# Patient Record
Sex: Male | Born: 1960 | Race: Black or African American | Hispanic: No | Marital: Single | Smoking: Former smoker
Health system: Southern US, Community
[De-identification: ages and names within clinical notes are randomized; demographics above are authoritative.]

## PROBLEM LIST (undated history)

## (undated) ENCOUNTER — Emergency Department (HOSPITAL_COMMUNITY): Admission: EM | Payer: Self-pay | Source: Home / Self Care

## (undated) DIAGNOSIS — G4733 Obstructive sleep apnea (adult) (pediatric): Secondary | ICD-10-CM

## (undated) DIAGNOSIS — M25559 Pain in unspecified hip: Secondary | ICD-10-CM

## (undated) DIAGNOSIS — E119 Type 2 diabetes mellitus without complications: Secondary | ICD-10-CM

## (undated) DIAGNOSIS — G43909 Migraine, unspecified, not intractable, without status migrainosus: Secondary | ICD-10-CM

## (undated) DIAGNOSIS — F329 Major depressive disorder, single episode, unspecified: Secondary | ICD-10-CM

## (undated) DIAGNOSIS — F32A Depression, unspecified: Secondary | ICD-10-CM

## (undated) DIAGNOSIS — K760 Fatty (change of) liver, not elsewhere classified: Secondary | ICD-10-CM

## (undated) DIAGNOSIS — G8929 Other chronic pain: Secondary | ICD-10-CM

## (undated) DIAGNOSIS — I10 Essential (primary) hypertension: Secondary | ICD-10-CM

## (undated) HISTORY — DX: Major depressive disorder, single episode, unspecified: F32.9

## (undated) HISTORY — DX: Essential (primary) hypertension: I10

## (undated) HISTORY — DX: Migraine, unspecified, not intractable, without status migrainosus: G43.909

## (undated) HISTORY — DX: Depression, unspecified: F32.A

## (undated) HISTORY — PX: HIP SURGERY: SHX245

---

## 1986-03-15 HISTORY — PX: WRIST SURGERY: SHX841

## 2013-12-25 ENCOUNTER — Telehealth: Payer: Self-pay | Admitting: Neurology

## 2013-12-25 ENCOUNTER — Ambulatory Visit (INDEPENDENT_AMBULATORY_CARE_PROVIDER_SITE_OTHER): Payer: Medicare HMO | Admitting: Neurology

## 2013-12-25 ENCOUNTER — Encounter: Payer: Self-pay | Admitting: Neurology

## 2013-12-25 ENCOUNTER — Encounter (INDEPENDENT_AMBULATORY_CARE_PROVIDER_SITE_OTHER): Payer: Self-pay

## 2013-12-25 VITALS — BP 106/67 | HR 83 | Ht 72.0 in | Wt 304.0 lb

## 2013-12-25 DIAGNOSIS — R45851 Suicidal ideations: Secondary | ICD-10-CM

## 2013-12-25 DIAGNOSIS — G44221 Chronic tension-type headache, intractable: Secondary | ICD-10-CM

## 2013-12-25 DIAGNOSIS — Z915 Personal history of self-harm: Secondary | ICD-10-CM

## 2013-12-25 DIAGNOSIS — G44229 Chronic tension-type headache, not intractable: Secondary | ICD-10-CM

## 2013-12-25 DIAGNOSIS — R419 Unspecified symptoms and signs involving cognitive functions and awareness: Secondary | ICD-10-CM

## 2013-12-25 DIAGNOSIS — F99 Mental disorder, not otherwise specified: Secondary | ICD-10-CM

## 2013-12-25 DIAGNOSIS — R413 Other amnesia: Secondary | ICD-10-CM

## 2013-12-25 DIAGNOSIS — R0683 Snoring: Secondary | ICD-10-CM

## 2013-12-25 DIAGNOSIS — Z9151 Personal history of suicidal behavior: Secondary | ICD-10-CM

## 2013-12-25 DIAGNOSIS — Z9189 Other specified personal risk factors, not elsewhere classified: Secondary | ICD-10-CM

## 2013-12-25 DIAGNOSIS — G4710 Hypersomnia, unspecified: Secondary | ICD-10-CM

## 2013-12-25 MED ORDER — TOPIRAMATE 25 MG PO TABS: 50.0000 mg | ORAL_TABLET | Freq: Two times a day (BID) | ORAL | Status: DC

## 2013-12-25 MED ORDER — TOPIRAMATE 25 MG PO TABS: ORAL_TABLET | ORAL | Status: DC
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
## 2013-12-25 NOTE — Telephone Encounter (Signed)
Patient calling to state that he went to his [facility] pharmacy on [address] to pick up his Topamax script but they said that hadn't received anything yet, please call and advise.

## 2013-12-25 NOTE — Telephone Encounter (Signed)
Rx has been resent.  I called the patient back, he is aware.

## 2013-12-25 NOTE — Patient Instructions (Addendum)
Overall you are doing fairly well but I do want to suggest a few things today:   Remember to drink plenty of fluid, eat healthy meals and do not skip any meals. Try to eat protein with a every meal and eat a healthy snack such as fruit or nuts in between meals. Try to keep a regular sleep-wake schedule and try to exercise daily, particularly in the form of walking, 20-30 minutes a day, if you can.   As far as your medications are concerned, I would like to suggest Topamax 25mg  pills. Most common side effects include weight loss, tingling in fingers, change in taste, cognitive changes. Start slowly and increase: Week one: One tablet at night Week two: 2 tablets at night Week three: 2 tablets at night and one tablet in morning Week four: 2 tablets twice daily  As far as diagnostic testing: MRI of the brain, sleep study. We will call to schedule.  I would like to see you back in 3 months, sooner if we need to. Please call us with any interim questions, concerns, problems, updates or refill requests.   Please also call us for any test results so we can go over those with you on the phone.  My clinical assistant and will answer any of your questions and relay your messages to me and also relay most of my messages to you.   Our phone number is [phone]. We also have an after hours call service for urgent matters and there is a physician on-call for urgent questions. For any emergencies you know to call 911 or go to the nearest emergency room

## 2013-12-26 ENCOUNTER — Telehealth: Payer: Self-pay | Admitting: Neurology

## 2013-12-26 ENCOUNTER — Encounter: Payer: Self-pay | Admitting: Neurology

## 2013-12-26 DIAGNOSIS — G4710 Hypersomnia, unspecified: Secondary | ICD-10-CM | POA: Insufficient documentation

## 2013-12-26 DIAGNOSIS — R45851 Suicidal ideations: Secondary | ICD-10-CM | POA: Insufficient documentation

## 2013-12-26 DIAGNOSIS — R0683 Snoring: Secondary | ICD-10-CM | POA: Insufficient documentation

## 2013-12-26 DIAGNOSIS — R419 Unspecified symptoms and signs involving cognitive functions and awareness: Secondary | ICD-10-CM | POA: Insufficient documentation

## 2013-12-26 DIAGNOSIS — Z915 Personal history of self-harm: Secondary | ICD-10-CM | POA: Insufficient documentation

## 2013-12-26 DIAGNOSIS — G4489 Other headache syndrome: Secondary | ICD-10-CM | POA: Insufficient documentation

## 2013-12-26 DIAGNOSIS — Z9151 Personal history of suicidal behavior: Secondary | ICD-10-CM | POA: Insufficient documentation

## 2013-12-26 NOTE — Addendum Note (Signed)
Addended by: Naomie DeanAHERN, Sadye Kiernan B on: 12/26/2013 11:58 PM   Modules accepted: Orders

## 2013-12-26 NOTE — Telephone Encounter (Signed)
Raymond MooreCasandra - Would you call patient and let him know that I would like him to have some labwork done. Orders are placed, he can go to any [facility] or come to our office during [facility] hours. Thanks.

## 2013-12-27 ENCOUNTER — Telehealth: Payer: Self-pay | Admitting: Neurology

## 2013-12-27 DIAGNOSIS — G4719 Other hypersomnia: Secondary | ICD-10-CM

## 2013-12-27 DIAGNOSIS — R519 Headache, unspecified: Secondary | ICD-10-CM

## 2013-12-27 DIAGNOSIS — R0683 Snoring: Secondary | ICD-10-CM

## 2013-12-27 DIAGNOSIS — R51 Headache: Secondary | ICD-10-CM

## 2013-12-27 NOTE — Telephone Encounter (Signed)
Dr. Lucia GaskinsAhern, refers patient for attended sleep study.  Height: 6'  Weight: 304lbs  BMI: 41.22  Past Medical History:  Hypertension  .  Migraine  .  Depression    Sleep Symptoms: Morbid obesity, HTN, snoring, daytime somnolence, headache. Snores very heavily, "runs people out of the room", has to nap during the day. Doesn't sleep through the night. Has been waking up frequently. Insomnia, depression, not enough sleep.    Epworth Score: Unable to reach the patient.   Medication: Albuterol Sulfate (Aero Soln) PROAIR HFA 108 (90 BASE) MCG/ACT Inhale 1 puff into the lungs every 6 (six) hours as needed. Cetirizine HCl (Tablet Dispersible) Cetirizine HCl 10 MG Take 1 tablet by mouth daily. Fluticasone Propionate (Suspension) FLONASE 50 MCG/ACT Place 2 sprays into both nostrils daily. Hydrocodone-Acetaminophen (Tab) NORCO/VICODIN 5-325 MG Take 1 tablet by mouth as needed. Losartan Potassium-HCTZ (Tab) HYZAAR 50-12.5 MG Take 1 tablet by mouth daily. Omeprazole (Capsule Delayed Release) PRILOSEC 20 MG Take 1 capsule by mouth daily. Simvastatin (Tab) ZOCOR 10 MG Take 1 tablet by mouth daily. Topiramate (Tab) TOPAMAX 25 MG Week 1: 1 tablet at night; Week 2: 2 tablets at night; Week 3: 2 tablets at night and 1 tablet in morn;Week 4: 2 tablets twice daily     Ins: [employer]/Medicaid   Assessment & Plan: 53 year old male with a PMHx of psychiatric problems, DM, morbid obesity, HTN, snoring, daytime somnolence, headache. Neuro exam is essentially normal with a MoCA of 28/30. I strongly suspect this patient has OSA which is likely a big contributor to both headaches and perceived memory changes. Orderine sleep study.  Memory loss: I suspect the perceived memory loss is multifactorial with contributions from obstructive sleep apnea, psychiatric problems and previous TBIs. Will order an MRI of the head. Will order a sleep study. Needs to continue regular and [facility], denies having plans of hurting himself or others but endorses suicidal thoughts. Will order serum labs.  Headache: suspect also multifactorial due to his psychiatric problems and stress, morbid obesity and likely OSA and maybe previous TBIs. Will order a sleep study and MRi of the brain. First line medications for tension-type headaches include TCAs however I worry those will cause weight gain and more memory complaints in this morbidly obese patient. Will start Topamax despite its cognitive side effects as this is a great headache medication and may help with weight loss.  RTC in 3 months   Please review patient information and submit instructions for scheduling and orders for sleep technologist. Thank you.

## 2013-12-27 NOTE — Telephone Encounter (Signed)
Called patient. Gave instructions per Dr. Trevor MaceAhern's previous note. Patient agreed to come to our office on 12/28/13. Provided lab hours.

## 2013-12-27 NOTE — Telephone Encounter (Signed)
Sleep study request review: This patient has an underlying medical history of severe obesity, hypertension, depression and migraine headaches and is referred by Dr. Lucia GaskinsAhern for an attended sleep study due to a report of loud snoring, and excessive daytime somnolence as well as recurrent headaches. I will order a split-night sleep study and see the patient in sleep medicine consultation afterwards. Please print this note and attach to chart.   Technologist instructions: Please score at 4% and split if 2 hour estimated AHI >15/h.    Huston FoleySaima Giordan Fordham, MD, PhD [name])

## 2013-12-28 ENCOUNTER — Other Ambulatory Visit (INDEPENDENT_AMBULATORY_CARE_PROVIDER_SITE_OTHER): Payer: Self-pay

## 2013-12-28 DIAGNOSIS — Z0289 Encounter for other administrative examinations: Secondary | ICD-10-CM

## 2013-12-28 DIAGNOSIS — R413 Other amnesia: Secondary | ICD-10-CM

## 2013-12-31 LAB — COMPREHENSIVE METABOLIC PANEL
ALT: 29 IU/L (ref 0–44)
AST: 18 IU/L (ref 0–40)
Albumin/Globulin Ratio: 1.5 (ref 1.1–2.5)
Albumin: 4.8 g/dL (ref 3.5–5.5)
Alkaline Phosphatase: 70 IU/L (ref 39–117)
BUN/Creatinine Ratio: 15 (ref 9–20)
BUN: 19 mg/dL (ref 6–24)
CALCIUM: 10.1 mg/dL (ref 8.7–10.2)
CHLORIDE: 97 mmol/L (ref 97–108)
CO2: 24 mmol/L (ref 18–29)
Creatinine, Ser: 1.3 mg/dL — ABNORMAL HIGH (ref 0.76–1.27)
GFR calc Af Amer: 73 mL/min/{1.73_m2} (ref >59)
GFR calc non Af Amer: 63 mL/min/{1.73_m2} (ref >59)
GLUCOSE: 225 mg/dL — AB (ref 65–99)
Globulin, Total: 3.2 g/dL (ref 1.5–4.5)
POTASSIUM: 4.3 mmol/L (ref 3.5–5.2)
Sodium: 138 mmol/L (ref 134–144)
TOTAL PROTEIN: 8 g/dL (ref 6.0–8.5)
Total Bilirubin: 0.4 mg/dL (ref 0.0–1.2)

## 2013-12-31 LAB — B12 AND FOLATE PANEL
Folate: 9.6 ng/mL (ref >3.0)
Vitamin B-12: 641 pg/mL (ref 211–946)

## 2013-12-31 LAB — HIV ANTIBODY (ROUTINE TESTING W REFLEX)
HIV 1/O/2 Abs-Index Value: <1 (ref <1.00)
HIV-1/HIV-2 Ab: NONREACTIVE

## 2013-12-31 LAB — VITAMIN B1, WHOLE BLOOD: THIAMINE: 126.2 nmol/L (ref 66.5–200.0)

## 2013-12-31 LAB — TSH: TSH: 1.53 u[IU]/mL (ref 0.450–4.500)

## 2013-12-31 LAB — RPR: SYPHILIS RPR SCR: NONREACTIVE

## 2014-01-25 ENCOUNTER — Other Ambulatory Visit: Payer: Self-pay

## 2014-02-01 ENCOUNTER — Ambulatory Visit
Admission: RE | Admit: 2014-02-01 | Discharge: 2014-02-01 | Disposition: A | Discharge status: Home / Self Care | Payer: Medicare HMO | Source: Ambulatory Visit | Attending: Neurology | Admitting: Neurology

## 2014-02-01 DIAGNOSIS — G44221 Chronic tension-type headache, intractable: Secondary | ICD-10-CM

## 2014-02-01 DIAGNOSIS — G44211 Episodic tension-type headache, intractable: Secondary | ICD-10-CM

## 2014-02-10 ENCOUNTER — Ambulatory Visit (INDEPENDENT_AMBULATORY_CARE_PROVIDER_SITE_OTHER): Payer: Medicare HMO | Admitting: Neurology

## 2014-02-10 DIAGNOSIS — G4761 Periodic limb movement disorder: Secondary | ICD-10-CM

## 2014-02-10 DIAGNOSIS — G4733 Obstructive sleep apnea (adult) (pediatric): Secondary | ICD-10-CM

## 2014-02-10 DIAGNOSIS — G4720 Circadian rhythm sleep disorder, unspecified type: Secondary | ICD-10-CM

## 2014-02-10 DIAGNOSIS — G479 Sleep disorder, unspecified: Secondary | ICD-10-CM

## 2014-02-10 NOTE — Sleep Study (Signed)
Please see the scanned sleep study interpretation located in the Procedure tab within the Chart Review section. 

## 2014-02-13 ENCOUNTER — Telehealth: Payer: Self-pay

## 2014-02-13 NOTE — Telephone Encounter (Signed)
Called patient. Gave results

## 2014-02-13 NOTE — Telephone Encounter (Signed)
-----   Message from Anson FretAntonia B Ahern, MD sent at 02/04/2014  7:56 PM EST ----- Please let patient know the MRI of his brain was unremarkable. Thank you.

## 2014-02-20 ENCOUNTER — Telehealth: Payer: Self-pay | Admitting: Neurology

## 2014-02-20 DIAGNOSIS — G4733 Obstructive sleep apnea (adult) (pediatric): Secondary | ICD-10-CM

## 2014-02-20 NOTE — Telephone Encounter (Signed)
Please call and notify the patient that the recent sleep study did confirm the diagnosis of - severe - obstructive sleep apnea and that I recommend treatment for this in the form of CPAP. This will require a repeat sleep study for proper titration and mask fitting. Please explain to patient and arrange for a CPAP titration study. I have placed an order in the chart. Thanks, Huston FoleySaima Darriana Deboy, MD, PhD [name] ([facility])  Pls CC report to Drs. Lucia GaskinsAhern and Concepcion ElkAvbuere

## 2014-02-21 ENCOUNTER — Encounter: Payer: Self-pay | Admitting: Neurology

## 2014-02-21 NOTE — Telephone Encounter (Signed)
The patient was contacted regarding the results from his recent sleep study.  He is aware of the finding of severe obstructive sleep apnea.  He the test also showed the patient having periodic limb movements which proved to be of clinical significance.  His insurance only approved a baseline sleep study so this did not allow the sleep technician to perform a SPLIT night in order to alleviate the apneic events.  The results from his sleep study [facility] to obtain approval for a CPAP titration study.  We have proceeded with scheduling this appointment for the patient while the authorization is pending approval.  He is aware of his sleep study appointment scheduled for 04/02/2014 @ 9:30pm.  The patient has requested to receive his sleep study results via mail.  A copy of these test results have been forwarded to Dr. Lucia GaskinsAhern and faxed to Dr. Fleet ContrasEdwin Avbuere.

## 2014-03-27 ENCOUNTER — Ambulatory Visit: Payer: Medicare HMO | Admitting: Neurology

## 2014-04-01 ENCOUNTER — Encounter: Payer: Self-pay | Admitting: Neurology

## 2014-04-01 ENCOUNTER — Ambulatory Visit (INDEPENDENT_AMBULATORY_CARE_PROVIDER_SITE_OTHER): Payer: Medicare HMO | Admitting: Neurology

## 2014-04-01 VITALS — BP 112/70 | HR 75 | Ht 73.0 in | Wt 291.2 lb

## 2014-04-01 DIAGNOSIS — G4733 Obstructive sleep apnea (adult) (pediatric): Secondary | ICD-10-CM | POA: Diagnosis not present

## 2014-04-01 MED ORDER — TOPIRAMATE 25 MG PO TABS: 75.0000 mg | ORAL_TABLET | Freq: Two times a day (BID) | ORAL | Status: DC
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
## 2014-04-01 NOTE — Patient Instructions (Signed)
Overall you are doing fairly well but I do want to suggest a few things today:   Remember to drink plenty of fluid, eat healthy meals and do not skip any meals. Try to eat protein with a every meal and eat a healthy snack such as fruit or nuts in between meals. Try to keep a regular sleep-wake schedule and try to exercise daily, particularly in the form of walking, 20-30 minutes a day, if you can.   As far as your medications are concerned, I would like to suggest: Can increase Topamax to 75mg  (3 tabs) twice daily.   As far as diagnostic testing: CPAP titration  I would like to see you back in 3-4 months, sooner if we need to. Please call us with any interim questions, concerns, problems, updates or refill requests.   Please also call us for any test results so we can go over those with you on the phone.  My clinical assistant and will answer any of your questions and relay your messages to me and also relay most of my messages to you.   Our phone number is [phone]. We also have an after hours call service for urgent matters and there is a physician on-call for urgent questions. For any emergencies you know to call 911 or go to the nearest emergency room

## 2014-04-02 ENCOUNTER — Ambulatory Visit (INDEPENDENT_AMBULATORY_CARE_PROVIDER_SITE_OTHER): Payer: Medicare HMO | Admitting: Neurology

## 2014-04-02 VITALS — BP 103/63

## 2014-04-02 DIAGNOSIS — G4733 Obstructive sleep apnea (adult) (pediatric): Secondary | ICD-10-CM

## 2014-04-02 DIAGNOSIS — G479 Sleep disorder, unspecified: Secondary | ICD-10-CM

## 2014-04-03 NOTE — Sleep Study (Signed)
Please see the scanned sleep study interpretation located in the Procedure tab within the Chart Review section. 

## 2014-04-11 ENCOUNTER — Telehealth: Payer: Self-pay | Admitting: Neurology

## 2014-04-11 DIAGNOSIS — G4733 Obstructive sleep apnea (adult) (pediatric): Secondary | ICD-10-CM

## 2014-04-11 NOTE — Telephone Encounter (Signed)
Please call and inform patient that I have entered an order for treatment with PAP. He did well during the latest sleep study with CPAP. We will, therefore, arrange for a machine for home use through a DME (durable medical equipment) company of His choice; and I will see the patient back in follow-up in about 6 weeks. Please also explain to the patient that I will be looking out for compliance data downloaded from the machine, which can be done remotely through a modem at times or stored on an SD card in the back of the machine. At the time of the followup appointment we will discuss sleep study results and how it is going with PAP treatment at home. Please advise patient to bring His machine at the time of the visit; at least for the first visit, even though this is cumbersome. Bringing the machine for every visit after that may not be needed, but often helps for the first visit. Please also make sure, the patient has a follow-up appointment with me in about 6 weeks from the setup date, thanks.   Aniesha Haughn, MD, PhD [name] ([facility])  

## 2014-04-12 ENCOUNTER — Encounter: Payer: Self-pay | Admitting: Neurology

## 2014-04-12 ENCOUNTER — Encounter: Payer: Self-pay | Admitting: *Deleted

## 2014-04-12 NOTE — Telephone Encounter (Signed)
Patient was contacted and provided the results of his CPAP titration study.  Patient was informed that there was significant improvement with use of therapy and near elimination of respiratory events.  Patient was in agreement with beginning CPAP treatment at home and was referred to [facility] due to [facility].  Dr. Lucia GaskinsAhern was routed a copy of the report.  The patient gave verbal permission to mail a copy of his test results.   Patient instructed to contact our office 6-8 weeks post set up to schedule a follow up appointment.

## 2014-06-11 ENCOUNTER — Telehealth: Payer: Self-pay | Admitting: Neurology

## 2014-06-11 MED ORDER — TOPIRAMATE 50 MG PO TABS: 75.0000 mg | ORAL_TABLET | Freq: Two times a day (BID) | ORAL | Status: DC

## 2014-06-11 NOTE — Telephone Encounter (Signed)
Weston BrassNick, Pharmacist with [facility] @ [phone], stated insurance will not pay for Rx topiramate (TOPAMAX) 25 MG tablet instructions of 3 tablets, twice daily.  Questioning if could be increased to 50mg  tabs, 1/2 tablets, twice a day.  Please call and advise.

## 2014-06-11 NOTE — Telephone Encounter (Signed)
Rx updated and sent.  I called the patient to advise.  He is aware.

## 2014-06-11 NOTE — Telephone Encounter (Signed)
Jessica - I am ok with 50mg  pills, 1.5 tabs bid daily. Would you order please and call them back? Thanks.

## 2014-07-15 ENCOUNTER — Telehealth: Payer: Self-pay | Admitting: *Deleted

## 2014-07-15 NOTE — Telephone Encounter (Signed)
Called and rescheduled appt from 08/09/14 to 08/08/14 at 8:15am. I told him to arrive 15 min before appointment time. Pt verbalized understanding.

## 2014-07-15 NOTE — Telephone Encounter (Signed)
Called patient and left message. Gave [facility] phone number and office hours. Trying to reschedule appt on 08/09/14 to a different day for 15 min follow-up since Dr. Lucia GaskinsAhern will not be here on Fridays.

## 2014-08-08 ENCOUNTER — Ambulatory Visit (INDEPENDENT_AMBULATORY_CARE_PROVIDER_SITE_OTHER): Payer: Medicare HMO | Admitting: Neurology

## 2014-08-08 ENCOUNTER — Encounter: Payer: Self-pay | Admitting: Neurology

## 2014-08-08 VITALS — BP 126/80 | HR 87 | Temp 98.0°F | Ht 73.0 in | Wt 303.4 lb

## 2014-08-08 DIAGNOSIS — G4733 Obstructive sleep apnea (adult) (pediatric): Secondary | ICD-10-CM | POA: Diagnosis not present

## 2014-08-08 DIAGNOSIS — G44229 Chronic tension-type headache, not intractable: Secondary | ICD-10-CM

## 2014-08-08 MED ORDER — TOPIRAMATE 50 MG PO TABS: 50.0000 mg | ORAL_TABLET | Freq: Two times a day (BID) | ORAL | Status: DC

## 2014-08-08 MED ORDER — NORTRIPTYLINE HCL 25 MG PO CAPS: 25.0000 mg | ORAL_CAPSULE | Freq: Every day | ORAL | Status: DC

## 2014-08-08 MED ORDER — TOPIRAMATE 50 MG PO TABS: 50.0000 mg | ORAL_TABLET | Freq: Two times a day (BID) | ORAL | Status: AC

## 2014-08-08 NOTE — Patient Instructions (Addendum)
Overall you are doing fairly well but I do want to suggest a few things today:   Remember to drink plenty of fluid, eat healthy meals and do not skip any meals. Try to eat protein with a every meal and eat a healthy snack such as fruit or nuts in between meals. Try to keep a regular sleep-wake schedule and try to exercise daily, particularly in the form of walking, 20-30 minutes a day, if you can.   As far as your medications are concerned, I would like to suggest:  Decrease Topamax to 50mg  (one pill) in the morning and one pill at night Start Nortriptyline 25mg  at bedtime  I would like to see you back in 6 months, sooner if we need to. Please call us with any interim questions, concerns, problems, updates or refill requests.   Please also call us for any test results so we can go over those with you on the phone.  My clinical assistant and will answer any of your questions and relay your messages to me and also relay most of my messages to you.   Our phone number is [phone]. We also have an after hours call service for urgent matters and there is a physician on-call for urgent questions. For any emergencies you know to call 911 or go to the nearest emergency room
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
## 2014-08-09 ENCOUNTER — Ambulatory Visit: Payer: Medicare HMO | Admitting: Neurology

## 2014-12-15 ENCOUNTER — Encounter (HOSPITAL_COMMUNITY): Payer: Self-pay | Admitting: Nurse Practitioner

## 2014-12-15 ENCOUNTER — Emergency Department (HOSPITAL_COMMUNITY)
Admission: EM | Admit: 2014-12-15 | Discharge: 2014-12-15 | Disposition: A | Discharge status: Home / Self Care | Payer: Medicare HMO | Attending: Emergency Medicine | Admitting: Emergency Medicine

## 2014-12-15 DIAGNOSIS — G43909 Migraine, unspecified, not intractable, without status migrainosus: Secondary | ICD-10-CM | POA: Insufficient documentation

## 2014-12-15 DIAGNOSIS — Z87891 Personal history of nicotine dependence: Secondary | ICD-10-CM | POA: Insufficient documentation

## 2014-12-15 DIAGNOSIS — Z7951 Long term (current) use of inhaled steroids: Secondary | ICD-10-CM | POA: Insufficient documentation

## 2014-12-15 DIAGNOSIS — M545 Low back pain, unspecified: Secondary | ICD-10-CM

## 2014-12-15 DIAGNOSIS — Z8659 Personal history of other mental and behavioral disorders: Secondary | ICD-10-CM | POA: Insufficient documentation

## 2014-12-15 DIAGNOSIS — Z79899 Other long term (current) drug therapy: Secondary | ICD-10-CM | POA: Insufficient documentation

## 2014-12-15 DIAGNOSIS — I10 Essential (primary) hypertension: Secondary | ICD-10-CM | POA: Diagnosis not present

## 2014-12-15 MED ORDER — CYCLOBENZAPRINE HCL 10 MG PO TABS: 10.0000 mg | ORAL_TABLET | Freq: Two times a day (BID) | ORAL | Status: DC | PRN

## 2014-12-15 MED ORDER — IBUPROFEN 800 MG PO TABS: 800.0000 mg | ORAL_TABLET | Freq: Three times a day (TID) | ORAL | Status: DC

## 2014-12-15 MED ORDER — HYDROCODONE-ACETAMINOPHEN 5-325 MG PO TABS: 2.0000 | ORAL_TABLET | ORAL | Status: AC | PRN

## 2014-12-15 NOTE — Discharge Instructions (Signed)
Lumbosacral Strain Lumbosacral strain is a strain of any of the parts that make up your lumbosacral vertebrae. Your lumbosacral vertebrae are the bones that make up the lower third of your backbone. Your lumbosacral vertebrae are held together by muscles and tough, fibrous tissue (ligaments).  CAUSES  A sudden blow to your back can cause lumbosacral strain. Also, anything that causes an excessive stretch of the muscles in the low back can cause this strain. This is typically seen when people exert themselves strenuously, fall, lift heavy objects, bend, or crouch repeatedly. RISK FACTORS  Physically demanding work.  Participation in pushing or pulling sports or sports that require a sudden twist of the back (tennis, golf, baseball).  Weight lifting.  Excessive lower back curvature.  Forward-tilted pelvis.  Weak back or abdominal muscles or both.  Tight hamstrings. SIGNS AND SYMPTOMS  Lumbosacral strain may cause pain in the area of your injury or pain that moves (radiates) down your leg.  DIAGNOSIS Your health care provider can often diagnose lumbosacral strain through a physical exam. In some cases, you may need tests such as X-ray exams.  TREATMENT  Treatment for your lower back injury depends on many factors that your clinician will have to evaluate. However, most treatment will include the use of anti-inflammatory medicines. HOME CARE INSTRUCTIONS   Avoid hard physical activities (tennis, racquetball, waterskiing) if you are not in proper physical condition for it. This may aggravate or create problems.  If you have a back problem, avoid sports requiring sudden body movements. Swimming and walking are generally safer activities.  Maintain good posture.  Maintain a healthy weight.  For acute conditions, you may put ice on the injured area.  Put ice in a plastic bag.  Place a towel between your skin and the bag.  Leave the ice on for 20 minutes, 2-3 times a day.  When the  low back starts healing, stretching and strengthening exercises may be recommended. SEEK MEDICAL CARE IF:  Your back pain is getting worse.  You experience severe back pain not relieved with medicines. SEEK IMMEDIATE MEDICAL CARE IF:   You have numbness, tingling, weakness, or problems with the use of your arms or legs.  There is a change in bowel or bladder control.  You have increasing pain in any area of the body, including your belly (abdomen).  You notice shortness of breath, dizziness, or feel faint.  You feel sick to your stomach (nauseous), are throwing up (vomiting), or become sweaty.  You notice discoloration of your toes or legs, or your feet get very cold. MAKE SURE YOU:   Understand these instructions.  Will watch your condition.  Will get help right away if you are not doing well or get worse. Document Released: 12/09/2004 Document Revised: 03/06/2013 Document Reviewed: 10/18/2012 [facility] Patient Information 2015 [place], [place]. This information is not intended to replace advice given to you by your health care provider. Make sure you discuss any questions you have with your health care provider.  Heat Therapy Heat therapy can help make painful, stiff muscles and joints feel better. Do not use heat on new injuries. Wait at least 48 hours after an injury to use heat. Do not use heat when you have aches or pains right after an activity. If you still have pain 3 hours after stopping the activity, then you may use heat. HOME CARE Wet heat pack  Soak a clean towel in warm water. Squeeze out the extra water.  Put the warm, wet  towel in a plastic bag.  Place a thin, dry towel between your skin and the bag.  Put the heat pack on the area for 5 minutes, and check your skin. Your skin may be pink, but it should not be red.  Leave the heat pack on the area for 15 to 30 minutes.  Repeat this every 2 to 4 hours while awake. Do not use heat while you are sleeping. Warm  water bath  Fill a tub with warm water.  Place the affected body part in the tub.  Soak the area for 20 to 40 minutes.  Repeat as needed. Hot water bottle  Fill the water bottle half full with hot water.  Press out the extra air. Close the cap tightly.  Place a dry towel between your skin and the bottle.  Put the bottle on the area for 5 minutes, and check your skin. Your skin may be pink, but it should not be red.  Leave the bottle on the area for 15 to 30 minutes.  Repeat this every 2 to 4 hours while awake. Electric heating pad  Place a dry towel between your skin and the heating pad.  Set the heating pad on low heat.  Put the heating pad on the area for 10 minutes, and check your skin. Your skin may be pink, but it should not be red.  Leave the heating pad on the area for 20 to 40 minutes.  Repeat this every 2 to 4 hours while awake.  Do not lie on the heating pad.  Do not fall asleep while using the heating pad.  Do not use the heating pad near water. GET HELP RIGHT AWAY IF:  You get blisters or red skin.  Your skin is puffy (swollen), or you lose feeling (numbness) in the affected area.  You have any new problems.  Your problems are getting worse.  You have any questions or concerns. If you have any problems, stop using heat therapy until you see your doctor. MAKE SURE YOU:  Understand these instructions.  Will watch your condition.  Will get help right away if you are not doing well or get worse. Document Released: 05/24/2011 Document Reviewed: 04/24/2013 [facility] Patient Information 2015 [place], [place]. This information is not intended to replace advice given to you by your health care provider. Make sure you discuss any questions you have with your health care provider.

## 2014-12-15 NOTE — ED Notes (Signed)
He c/o 1 week history R lower back pain. Pain increased with stretching and moving. Pain not increased with palpation. Pain does not radiate. Pt tried heat and some leftover antibiotics he had with no relief of the pain. Denies fecvers, n/v, bowel/bladder changes.

## 2014-12-15 NOTE — ED Notes (Signed)
Declined W/C at D/C and was escorted to lobby by RN. 

## 2014-12-15 NOTE — ED Provider Notes (Signed)
CSN: [id number]     Arrival date & time 12/15/14  1149 History  By signing my name below, I, Lyndel Safe, attest that this documentation has been prepared under the direction and in the presence of  Danelle Berry, PA-C. Electronically Signed: Lyndel Safe, ED Scribe. 12/15/2014. 6:05 PM.  Chief Complaint  Patient presents with  . Back Pain   The history is provided by the patient. No language interpreter was used.   HPI Comments: Raymond Trevino is a 54 y.o. male, with PShx of bilateral total hip arthroplasty, who presents to the Emergency Department complaining of constant, non-radiating, right paraspinal lumbar back pain X 1 week that is exacerbated with laying down, bending over and twisting. Pt has applied heating pads and taken hydrocodone prescription with intermittent relief. His pain is unrelieved by antibiotics from an old prescription from hip arthralgias. Pt has been ambulatory but notes he is moving slower than normal secondary to pain with ambulation. He denies injury, trauma or fall attributable to the pain, fevers or chills, IV drug abuse, dysuria, numbness, tingling or weakness in RLE or bladder or bowel incontinence. Also denies radiation of pain posteriorly down BLE. Pt has a PCP appointment in the upcoming week.   Past Medical History  Diagnosis Date  . Hypertension   . Migraine   . Depression    Past Surgical History  Procedure Laterality Date  . Hip surgery  2012, 2013  . Wrist surgery  1988   Family History  Problem Relation Age of Onset  . Other Father     blunt force trauma  . Migraines Neg Hx    Social History  Substance Use Topics  . Smoking status: Former Smoker    Quit date: 03/15/2004  . Smokeless tobacco: Never Used  . Alcohol Use: Yes     Comment: drink every 2 months    Review of Systems  Constitutional: Negative for fever and chills.  Genitourinary: Negative for dysuria.  Musculoskeletal: Positive for back pain. Negative for gait  problem.  Neurological: Negative for weakness and numbness.   Allergies  Review of patient's allergies indicates no known allergies.  Home Medications   Prior to Admission medications   Medication Sig Start Date End Date Taking? Authorizing Provider  amoxicillin (AMOXIL) 500 MG capsule  07/29/14   Historical Provider, MD  Cetirizine HCl 10 MG TBDP Take 1 tablet by mouth daily.    Historical Provider, MD  cyclobenzaprine (FLEXERIL) 10 MG tablet Take 1 tablet (10 mg total) by mouth 2 (two) times daily as needed for muscle spasms. 12/15/14   Danelle Berry, PA-C  fluticasone (FLONASE) 50 MCG/ACT nasal spray Place 2 sprays into both nostrils daily. 12/05/13   Historical Provider, MD  HYDROcodone-acetaminophen (NORCO/VICODIN) 5-325 MG tablet Take 2 tablets by mouth every 4 (four) hours as needed. 12/15/14   Danelle Berry, PA-C  ibuprofen (ADVIL,MOTRIN) 800 MG tablet Take 1 tablet (800 mg total) by mouth 3 (three) times daily. 12/15/14   Danelle Berry, PA-C  losartan-hydrochlorothiazide (HYZAAR) 50-12.5 MG per tablet Take 1 tablet by mouth daily. 11/28/13   Historical Provider, MD  nortriptyline (PAMELOR) 25 MG capsule Take 1 capsule (25 mg total) by mouth at bedtime. 08/08/14   Anson Fret, MD  omeprazole (PRILOSEC) 20 MG capsule Take 1 capsule by mouth daily. 11/28/13   Historical Provider, MD  PROAIR HFA 108 (90 BASE) MCG/ACT inhaler Inhale 1 puff into the lungs every 6 (six) hours as needed. 11/26/13   Historical Provider, MD  simvastatin (ZOCOR) 10 MG tablet Take 1 tablet by mouth daily. 12/17/13   Historical Provider, MD  topiramate (TOPAMAX) 50 MG tablet Take 1 tablet (50 mg total) by mouth 2 (two) times daily. 08/08/14   Anson Fret, MD   BP 131/80 mmHg  Pulse 83  Temp(Src) 98 F (36.7 C) (Oral)  Resp 17  SpO2 95% Physical Exam  Constitutional: He is oriented to person, place, and time. He appears well-developed and well-nourished. No distress.  HENT:  Head: Normocephalic.  Eyes:  Conjunctivae are normal.  Neck: Normal range of motion. Neck supple.  Cardiovascular: Normal rate.   Pulmonary/Chest: Effort normal. No respiratory distress.  Musculoskeletal: Normal range of motion. He exhibits tenderness.  Good sensation, good strength with dorsi and plantar flexion, normal gait, TTP over right lumbar paraspinals, no midline tenderness.   Neurological: He is alert and oriented to person, place, and time. Coordination normal.  Skin: Skin is warm.  Psychiatric: He has a normal mood and affect. His behavior is normal.  Nursing note and vitals reviewed.   ED Course  Procedures  DIAGNOSTIC STUDIES: Oxygen Saturation is 95% on RA, adequate by my interpretation.    COORDINATION OF CARE: 1:08 PM Discussed treatment plan with pt at bedside and pt agreed to plan. Will prescribe pain management medication and a muscle relaxant.   MDM   Final diagnoses:  Right-sided low back pain without sciatica    Patient with back pain.  No neurological deficits and normal neuro exam.  Patient can walk but states is painful.  No loss of bowel or bladder control.  No concern for cauda equina.  No fever, night sweats, weight loss, h/o cancer, IVDU.  RICE protocol and pain medicine indicated and discussed with patient.  Filed Vitals:   12/15/14 1157 12/15/14 1340  BP: 131/80 122/75  Pulse: 83 73  Temp: 98 F (36.7 C) 97.9 F (36.6 C)  TempSrc: Oral Oral  Resp: 17 16  SpO2: 95% 95%     I personally performed the services described in this documentation, which was scribed in my presence. The recorded information has been reviewed and is accurate.    Danelle Berry, PA-C 12/16/14 1805  Gwyneth Sprout, MD 12/16/14 [phone]

## 2015-01-15 ENCOUNTER — Telehealth: Payer: Self-pay | Admitting: Neurology

## 2015-01-15 NOTE — Telephone Encounter (Signed)
Please ask patient to come for appt in sleep clinic. Don't know how he never followed up. In the interim, pls ask him to contact his DME for CPAP mask and tolerance issues.

## 2015-01-15 NOTE — Telephone Encounter (Signed)
I called patient. The patient is on CPAP, he is having tolerance tolerating this because of stuffiness in the nasal passageways, he apparently has been seen by ENT previously, he was placed on Flonase, this is helped some, but he is still a problem for him. He has never been seen by Dr. Frances FurbishAthar, but she has been involved with interpretation of the sleep evaluation. I will refer this issue to her to see there are any quick solutions. At some point, the patient may need to be seen by a sleep physician here.

## 2015-01-16 NOTE — Telephone Encounter (Signed)
I spoke to patient. He reports that the machine is working well, his sinus issues are a problem right now. He made appt for Tuesday to see Dr. Frances FurbishAthar and discuss CPAP>

## 2015-01-21 ENCOUNTER — Encounter: Payer: Self-pay | Admitting: Neurology

## 2015-01-21 ENCOUNTER — Ambulatory Visit (INDEPENDENT_AMBULATORY_CARE_PROVIDER_SITE_OTHER): Payer: Medicare HMO | Admitting: Neurology

## 2015-01-21 VITALS — BP 112/68 | HR 80 | Resp 18 | Ht 73.0 in | Wt 294.0 lb

## 2015-01-21 DIAGNOSIS — E669 Obesity, unspecified: Secondary | ICD-10-CM

## 2015-01-21 DIAGNOSIS — J3089 Other allergic rhinitis: Secondary | ICD-10-CM

## 2015-01-21 DIAGNOSIS — R51 Headache: Secondary | ICD-10-CM

## 2015-01-21 DIAGNOSIS — R0981 Nasal congestion: Secondary | ICD-10-CM

## 2015-01-21 DIAGNOSIS — G4733 Obstructive sleep apnea (adult) (pediatric): Secondary | ICD-10-CM

## 2015-01-21 DIAGNOSIS — R519 Headache, unspecified: Secondary | ICD-10-CM

## 2015-01-21 NOTE — Progress Notes (Signed)
Subjective:    Patient ID: Raymond Trevino is a 54 y.o. male.  HPI     Raymond Foley, MD, PhD [facility] [address], [address] [address] [place], [place] [address]  Dear Raymond Trevino,   I saw your patient, Raymond Trevino, upon your kind request in my clinic today for initial consultation of his sleep disorder, in particular his obstructive sleep apnea, after his sleep studies. The patient is unaccompanied today. As you know, Raymond Trevino is a 54 year old right-handed gentleman with an underlying medical history of hypertension, depression, migraine headaches and obesity, who had a baseline sleep study followed by a full night CPAP titration study. I went over his test results with him in detail today. His baseline sleep study from 02/10/2014 showed a sleep efficiency of 77.2% with a latency to sleep of 22.5 minutes and wake after sleep onset of 68 minutes with severe sleep fragmentation noted. He had an elevated arousal index secondary to respiratory events. He had an increased percentage of light stage sleep, absence of slow-wave sleep and a decreased percentage of REM sleep at 9% with a REM latency of 67.5 minutes. He had moderate PLMS with mild arousals. He had no significant EKG changes or EEG changes. He had moderate to loud snoring. Total AHI was 33.3 per hour, rising to 43.6 per hour during REM sleep and 40 per hour in the supine position. Supine REM sleep was not achieved. Average oxygen saturation was 92%, nadir was 78% during REM sleep. Based on his test results I invited him back for a full night titration study. He had this on 04/02/2014. Sleep efficiency was 85% with a latency to sleep of 11.5 minutes and wake after sleep onset of 49.5 minutes with mild to moderate sleep fragmentation noted. He had a normal arousal index. He had a increased percentage of stage II sleep, absence of slow-wave sleep and REM sleep at 16.2% with a REM latency of 69 minutes. He had no  significant PLMS or EKG or EEG changes. He slept only on his back. Average oxygen saturation was 95%, nadir was 89%. CPAP was titrated from 5 cm to 9 cm. AHI was 1.3 per hour at the final pressure with supine REM sleep achieved. Based on his test results are prescribed CPAP therapy for home use. He did not come back for follow-up.   Today, 01/21/2015: I reviewed his CPAP compliance data from 10/21/2014 through 01/18/2015 which is a total of 90 days during which time he used his machine 35 days with percent used days greater than 4 hours at 38%, indicating poor compliance, average AHI 1 per hour, leak acceptable with the 95th percentile at 11.6 L/m on a pressure of 9 cm with EPR of 2. Average usage for all days only 1 hour and 38 minutes. I reviewed his most recent compliance data from 12/20/2014 through 01/18/2015 which is a total of 30 days during which time he used his machine only one time, indicating noncompliance.   Today, 01/21/2015: He reports that initially he tried using the machine but he started having more and more difficulty with sinus infections and nasal congestion. He says even had to go to the emergency room to be treated for sinusitis. He was given antibiotics. In the past couple of months his headaches have become worse. He wakes up with a headache almost every other day. He has significant nocturia of 4-5 times per night on average. He does not feel that his headache prevention medication is  working. He is also looking into seeing a pain management doctor as he has residual hip pain. He is status post bilateral hip replacement surgeries. He is on disability. 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 He has essentially stopped using CPAP in the month of October. He feels congested all the time. He has been using Flonase which helps some. He has a history of year-round allergies. He has not seen an ENT physician. He would like to see someone for this. His weight has  been stable. He would like to talk about alternative treatments to CPAP therapy as he feels he cannot use it any longer. He has a nasal mask and has not been in touch with his DME company about alternative mass or other help with CPAP tolerance he admits. He drinks caffeine infrequently and drinks alcohol infrequently. He stopped smoking 10 years ago. He has grown children. He lives with his girlfriend. I reviewed your office note from 08/08/2014. He indicated compliance with CPAP therapy at the time. He had residual headaches. He felt better on CPAP including feeling of better energy.  His Past Medical History Is Significant For: Past Medical History  Diagnosis Date  . Hypertension   . Migraine   . Depression     His Past Surgical History Is Significant For: Past Surgical History  Procedure Laterality Date  . Hip surgery  2012, 2013  . Wrist surgery  1988    His Family History Is Significant For: Family History  Problem Relation Age of Onset  . Other Father     blunt force trauma  . Migraines Neg Hx     His Social History Is Significant For: Social History   Social History  . Marital Status: Significant Other    Spouse Name: Raymond Trevino  . Number of Children: 3  . Years of Education: HS   Occupational History  .  Other    n/a   Social History Main Topics  . Smoking status: Former Smoker    Quit date: 03/15/2004  . Smokeless tobacco: Never Used  . Alcohol Use: Yes     Comment: drink every 2 months  . Drug Use: No  . Sexual Activity: Not Asked   Other Topics Concern  . None   Social History Narrative   Pt lives at home with his significant other.   Caffeine Use: Coffee twice a week    His Allergies Are:  No Known Allergies:   His Current Medications Are:  Outpatient Encounter Prescriptions as of 01/21/2015  Medication Sig  . amoxicillin (AMOXIL) 500 MG capsule   . Cetirizine HCl 10 MG TBDP Take 1 tablet by mouth daily.  . cyclobenzaprine (FLEXERIL)  10 MG tablet Take 1 tablet (10 mg total) by mouth 2 (two) times daily as needed for muscle spasms.  . fluticasone (FLONASE) 50 MCG/ACT nasal spray Place 2 sprays into both nostrils daily.  Marland Kitchen HYDROcodone-acetaminophen (NORCO/VICODIN) 5-325 MG tablet Take 2 tablets by mouth every 4 (four) hours as needed.  Marland Kitchen ibuprofen (ADVIL,MOTRIN) 800 MG tablet Take 1 tablet (800 mg total) by mouth 3 (three) times daily.  . INVOKAMET 150-500 MG TABS   . losartan-hydrochlorothiazide (HYZAAR) 50-12.5 MG per tablet Take 1 tablet by mouth daily.  . nortriptyline (PAMELOR) 25 MG capsule Take 1 capsule (25 mg total) by mouth at bedtime.  Marland Kitchen omeprazole (PRILOSEC) 20 MG capsule Take 1 capsule by mouth daily.  Marland Kitchen PROAIR HFA 108 (90 BASE) MCG/ACT  inhaler Inhale 1 puff into the lungs every 6 (six) hours as needed.  . simvastatin (ZOCOR) 10 MG tablet Take 1 tablet by mouth daily.  Marland Kitchen. topiramate (TOPAMAX) 50 MG tablet Take 1 tablet (50 mg total) by mouth 2 (two) times daily.   No facility-administered encounter medications on file as of 01/21/2015.  :  Review of Systems:  Out of a complete 14 point review of systems, all are reviewed and negative with the exception of these symptoms as listed below:   Review of Systems  Neurological:       Patient has had sleep study in our lab and was put on CPAP. He has never f/u with Dr. Frances FurbishAthar for evaluation.   He reports not being able to use CPAP machine due to sinus issues. States he has been on several rounds of antibiotics, etc. He would like to come off of CPAP.     Objective:  Neurologic Exam  Physical Exam Physical Examination:   Filed Vitals:   01/21/15 0905  BP: 112/68  Pulse: 80  Resp: 18    General Examination: The patient is a very pleasant 54 y.o. male in no acute distress. He appears well-developed and well-nourished and well groomed. He is obese.   HEENT: Normocephalic, atraumatic, pupils are equal, round and reactive to light and accommodation. Funduscopic  exam is normal with sharp disc margins noted. Extraocular tracking is good without limitation to gaze excursion or nystagmus noted. Normal smooth pursuit is noted. Hearing is grossly intact. Tympanic membranes are clear bilaterally. Face is symmetric with normal facial animation and normal facial sensation. Speech is clear with no dysarthria noted. There is no hypophonia. There is no lip, neck/head, jaw or voice tremor. Neck is supple with full range of passive and active motion. There are no carotid bruits on auscultation. Oropharynx exam reveals: mild mouth dryness, adequate dental hygiene and marked airway crowding, due to smaller airway entry, redundant and thick soft palate, large uvula and tonsils in place, 2+ bilaterally. Mallampati is class III. Tongue protrudes centrally and palate elevates symmetrically. Neck size is 20 inches. He has a Mild overbite. Nasal inspection reveals significant nasal mucosal bogginess and redness and no significant septal deviation.   Chest: Clear to auscultation without wheezing, rhonchi or crackles noted.  Heart: S1+S2+0, regular and normal without murmurs, rubs or gallops noted.   Abdomen: Soft, non-tender and non-distended with normal bowel sounds appreciated on auscultation.  Extremities: There is no pitting edema in the distal lower extremities bilaterally. Pedal pulses are intact.  Skin: Warm and dry without trophic changes noted. There are no varicose veins.  Musculoskeletal: exam reveals no obvious joint deformities, tenderness or joint swelling or erythema, with the exception of decrease in range of motion in both hips.  Neurologically:  Mental status: The patient is awake, alert and oriented in all 4 spheres. His immediate and remote memory, attention, language skills and fund of knowledge are appropriate. There is no evidence of aphasia, agnosia, apraxia or anomia. Speech is clear with normal prosody and enunciation. Thought process is linear. Mood is  normal and affect is normal.  Cranial nerves II - XII are as described above under HEENT exam. In addition: shoulder shrug is normal with equal shoulder height noted. Motor exam: Normal bulk, strength and tone is noted. There is no drift, tremor or rebound. Romberg is negative. Reflexes are 1+ throughout. Fine motor skills and coordination: intact.  Sensory exam: intact to light touch n the upper and lower extremities.  Gait, station and balance: He stands with mild difficulty. No veering to one side is noted. No leaning to one side is noted. Posture is age-appropriate and stance is narrow based. Gait shows normal stride length and normal pace. No problems turning are noted. He turns en bloc.                Assessment and Plan:   In summary, LECIL TAPP is a very pleasant 54 y.o.-year old male with an underlying medical history of hypertension, depression, migraine headaches and obesity, who presents for follow-up consultation of his severe obstructive sleep apnea, after his 2 sleep studies in November 2015 and January 2016, respectively. I had a long chat with the patient about my findings and the diagnosis of OSA and we went over his sleep study results in detail today.he sees the difference between the first study on the second study. On examination he does indeed have significant airway crowding and nasal congestion with mucosal irritation noted and swollen uvula as well as tonsils in place. While weight loss will help, I think he needs to consider CPAP therapy as his best treatment option however, he will benefit from a surgical evaluation. Perhaps nasal turbinate surgery and upper airway surgery could help him tolerate CPAP better. In the interim, I would also like to prescribe a full facemask so he can try this. I agree that a nasal mask at this point is not going to help him. At this juncture, I placed a referral to ENT. He has an appointment with you tomorrow to talk about his residual  headaches. I explained to him that once he is treated for his severe obstructive sleep apnea he may find that his nocturia, and his morning headaches will improve. I explained the risks and ramifications of untreated moderate to severe OSA, especially with respect to developing cardiovascular disease down the Road, including congestive heart failure, difficult to treat hypertension, cardiac arrhythmias, or stroke. Even type 2 diabetes has, in part, been linked to untreated OSA. Symptoms of untreated OSA include daytime sleepiness, memory problems, mood irritability and mood disorder such as depression and anxiety, lack of energy, as well as recurrent headaches, especially morning headaches. We talked about trying to maintain a healthy lifestyle in general, as well as the importance of weight control. I encouraged the patient to eat healthy, exercise daily and keep well hydrated, to keep a scheduled bedtime and wake time routine, to not skip any meals and eat healthy snacks in between meals. I advised the patient not to drive when feeling sleepy. I recommended the following at this time: Try CPAP with a full facemask, referral to ENT for surgical consultation for sleep apnea treatment.I explained the importance of being compliant with PAP treatment, not only for insurance purposes but primarily to improve His symptoms, and for the patient's long term health benefit, including to reduce His cardiovascular risks. I answered all his questions today and the patient was in agreement. I would like to see him back in about 3 months for recheck. He is advised to call in the interim should he have any sleep-related questions or concerns. Thank you very much for allowing me to participate in the care of this nice patient. If I can be of any further assistance to you please do not hesitate to talk to me.  Sincerely,   Raymond Foley, MD, PhD

## 2015-01-21 NOTE — Patient Instructions (Signed)
We will set you up with a consultation with an ENT physician for nasal congestion and tight airway, which makes it difficult for you to use your CPAP.  We will also let you try a full face mask for your CPAP machine.  I will see you back in about 3 months.

## 2015-01-22 ENCOUNTER — Encounter: Payer: Self-pay | Admitting: Neurology

## 2015-01-22 ENCOUNTER — Ambulatory Visit (INDEPENDENT_AMBULATORY_CARE_PROVIDER_SITE_OTHER): Payer: Medicare HMO | Admitting: Neurology

## 2015-01-22 VITALS — BP 137/78 | HR 100 | Ht 73.0 in | Wt 294.0 lb

## 2015-01-22 DIAGNOSIS — S0990XA Unspecified injury of head, initial encounter: Secondary | ICD-10-CM

## 2015-01-22 DIAGNOSIS — G44221 Chronic tension-type headache, intractable: Secondary | ICD-10-CM | POA: Diagnosis not present

## 2015-01-22 MED ORDER — AMITRIPTYLINE HCL 100 MG PO TABS: 100.0000 mg | ORAL_TABLET | Freq: Every day | ORAL | Status: DC
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
## 2015-01-22 NOTE — Patient Instructions (Signed)
Overall you are doing fairly well but I do want to suggest a few things today:   Remember to drink plenty of fluid, eat healthy meals and do not skip any meals. Try to eat protein with a every meal and eat a healthy snack such as fruit or nuts in between meals. Try to keep a regular sleep-wake schedule and try to exercise daily, particularly in the form of walking, 20-30 minutes a day, if you can.   As far as your medications are concerned, I would like to suggest: Stop Nortriptyline. Start Amitriptyline an dhour before bed.  As far as diagnostic testing: CT of the head  Our phone number is [phone]. We also have an after hours call service for urgent matters and there is a physician on-call for urgent questions. For any emergencies you know to call 911 or go to the nearest emergency room

## 2015-01-23 ENCOUNTER — Telehealth: Payer: Self-pay | Admitting: *Deleted

## 2015-01-23 LAB — COMPREHENSIVE METABOLIC PANEL
A/G RATIO: 1.5 (ref 1.1–2.5)
ALBUMIN: 4.7 g/dL (ref 3.5–5.5)
ALK PHOS: 86 IU/L (ref 39–117)
ALT: 37 IU/L (ref 0–44)
AST: 21 IU/L (ref 0–40)
BUN / CREAT RATIO: 11 (ref 9–20)
BUN: 13 mg/dL (ref 6–24)
Bilirubin Total: 0.5 mg/dL (ref 0.0–1.2)
CALCIUM: 10 mg/dL (ref 8.7–10.2)
CO2: 22 mmol/L (ref 18–29)
Chloride: 96 mmol/L — ABNORMAL LOW (ref 97–106)
Creatinine, Ser: 1.18 mg/dL (ref 0.76–1.27)
GFR calc Af Amer: 81 mL/min/{1.73_m2} (ref >59)
GFR, EST NON AFRICAN AMERICAN: 70 mL/min/{1.73_m2} (ref >59)
GLOBULIN, TOTAL: 3.1 g/dL (ref 1.5–4.5)
Glucose: 159 mg/dL — ABNORMAL HIGH (ref 65–99)
POTASSIUM: 3.9 mmol/L (ref 3.5–5.2)
SODIUM: 138 mmol/L (ref 136–144)
Total Protein: 7.8 g/dL (ref 6.0–8.5)

## 2015-01-23 LAB — C-REACTIVE PROTEIN: CRP: 11.9 mg/L — ABNORMAL HIGH (ref 0.0–4.9)

## 2015-01-23 LAB — SEDIMENTATION RATE: Sed Rate: 15 mm/hr (ref 0–30)

## 2015-01-23 NOTE — Telephone Encounter (Signed)
LMOM both home and celll #'s to call for lab results/fim

## 2015-01-28 NOTE — Telephone Encounter (Signed)
LMTC./fim 

## 2015-01-28 NOTE — Telephone Encounter (Signed)
Pt returned call  About lab work .

## 2015-01-29 ENCOUNTER — Encounter: Payer: Self-pay | Admitting: *Deleted

## 2015-01-29 NOTE — Telephone Encounter (Signed)
LMTC./fim 

## 2015-01-30 NOTE — Telephone Encounter (Signed)
Pt called back and I gave him the results of the lab tests that he had done in the office.  Unremarkable per Dr. Lucia GaskinsAhern.   No additional lab work needed.    I told pt that did send a letter stating result note as well as lab results to him since we had not heard back from him.  He verbalized understanding.

## 2015-02-05 ENCOUNTER — Other Ambulatory Visit: Payer: Medicare HMO

## 2015-02-10 ENCOUNTER — Ambulatory Visit: Payer: Medicare HMO | Admitting: Neurology

## 2015-02-17 ENCOUNTER — Encounter: Payer: Self-pay | Admitting: *Deleted

## 2015-02-17 ENCOUNTER — Telehealth: Payer: Self-pay | Admitting: *Deleted

## 2015-02-17 ENCOUNTER — Ambulatory Visit
Admission: RE | Admit: 2015-02-17 | Discharge: 2015-02-17 | Disposition: A | Discharge status: Home / Self Care | Payer: Medicare HMO | Source: Ambulatory Visit | Attending: Neurology | Admitting: Neurology

## 2015-02-17 DIAGNOSIS — S0990XA Unspecified injury of head, initial encounter: Secondary | ICD-10-CM

## 2015-02-17 NOTE — Telephone Encounter (Signed)
-----   Message from Anson FretAntonia B Ahern, MD sent at 02/17/2015  1:44 PM EST ----- Brain appears normal thanks

## 2015-02-17 NOTE — Telephone Encounter (Signed)
Spoke w/ pt about normal CT head per Dr Lucia GaskinsAhern. Pt verbalized understanding and would like letter stating this. I told him I will mail him letter today. He verbalized understanding.

## 2015-04-23 ENCOUNTER — Ambulatory Visit: Payer: Medicare HMO | Admitting: Neurology

## 2015-04-29 ENCOUNTER — Ambulatory Visit (INDEPENDENT_AMBULATORY_CARE_PROVIDER_SITE_OTHER): Payer: Medicare HMO | Admitting: Neurology

## 2015-04-29 ENCOUNTER — Encounter: Payer: Self-pay | Admitting: Neurology

## 2015-04-29 ENCOUNTER — Encounter: Payer: Self-pay | Admitting: *Deleted

## 2015-04-29 VITALS — BP 139/82 | HR 97 | Ht 73.0 in | Wt 294.8 lb

## 2015-04-29 DIAGNOSIS — E669 Obesity, unspecified: Secondary | ICD-10-CM | POA: Diagnosis not present

## 2015-04-29 DIAGNOSIS — G4441 Drug-induced headache, not elsewhere classified, intractable: Secondary | ICD-10-CM | POA: Diagnosis not present

## 2015-04-29 DIAGNOSIS — G4440 Drug-induced headache, not elsewhere classified, not intractable: Secondary | ICD-10-CM

## 2015-04-29 DIAGNOSIS — G4733 Obstructive sleep apnea (adult) (pediatric): Secondary | ICD-10-CM

## 2015-04-29 DIAGNOSIS — G4489 Other headache syndrome: Secondary | ICD-10-CM
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
## 2015-04-29 NOTE — Progress Notes (Signed)
Mailed OV note dated 04/29/15 per pt request.

## 2015-05-19 ENCOUNTER — Telehealth: Payer: Self-pay | Admitting: *Deleted

## 2015-05-19 NOTE — Telephone Encounter (Signed)
Patient form on Emma desk. 

## 2015-05-20 DIAGNOSIS — Z0289 Encounter for other administrative examinations: Secondary | ICD-10-CM

## 2015-06-11 NOTE — Telephone Encounter (Signed)
Patient called to check status of form that was "dropped off a couple of weeks ago". Please call patient to advise.

## 2015-06-11 NOTE — Telephone Encounter (Signed)
Dr Lucia GaskinsAhern- Are you finished with his paperwork? Please advise   Called pt back. Advised Dr Lucia GaskinsAhern still working on paperwork. I will ask her about status of paperwork and call him back tomorrow to update him. He verbalized understanding.

## 2015-06-12 DIAGNOSIS — G4440 Drug-induced headache, not elsewhere classified, not intractable: Secondary | ICD-10-CM | POA: Insufficient documentation

## 2015-06-12 DIAGNOSIS — E669 Obesity, unspecified: Secondary | ICD-10-CM | POA: Insufficient documentation

## 2015-06-12 NOTE — Telephone Encounter (Signed)
Gave completed form back to MR. Pt pick up.

## 2015-08-20 ENCOUNTER — Ambulatory Visit (INDEPENDENT_AMBULATORY_CARE_PROVIDER_SITE_OTHER): Payer: Medicare HMO | Admitting: Neurology

## 2015-08-20 ENCOUNTER — Encounter: Payer: Self-pay | Admitting: Neurology

## 2015-08-20 VITALS — BP 135/85 | HR 85 | Ht 73.0 in | Wt 298.8 lb

## 2015-08-20 DIAGNOSIS — G4730 Sleep apnea, unspecified: Secondary | ICD-10-CM

## 2015-08-20 DIAGNOSIS — Z9119 Patient's noncompliance with other medical treatment and regimen: Secondary | ICD-10-CM

## 2015-08-20 DIAGNOSIS — Z91199 Patient's noncompliance with other medical treatment and regimen due to unspecified reason: Secondary | ICD-10-CM | POA: Insufficient documentation

## 2015-08-20 NOTE — Patient Instructions (Signed)
Overall you are doing fairly well but I do want to suggest a few things today:   Remember to drink plenty of fluid, eat healthy meals and do not skip any meals. Try to eat protein with a every meal and eat a healthy snack such as fruit or nuts in between meals. Try to keep a regular sleep-wake schedule and try to exercise daily, particularly in the form of walking, 20-30 minutes a day, if you can.   As far as your medications are concerned, I would like to suggest  As far as diagnostic testing: Will refer him to specialty traumatic brain injury clinic.  Our phone number is [phone]. We also have an after hours call service for urgent matters and there is a physician on-call for urgent questions. For any emergencies you know to call 911 or go to the nearest emergency room
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
## 2015-09-11 ENCOUNTER — Encounter: Payer: Self-pay | Admitting: Neurology

## 2015-09-11 ENCOUNTER — Ambulatory Visit (INDEPENDENT_AMBULATORY_CARE_PROVIDER_SITE_OTHER): Payer: Medicare HMO | Admitting: Neurology

## 2015-09-11 VITALS — BP 115/71 | HR 95 | Resp 18 | Ht 73.0 in | Wt 297.0 lb

## 2015-09-11 DIAGNOSIS — Z9989 Dependence on other enabling machines and devices: Principal | ICD-10-CM

## 2015-09-11 DIAGNOSIS — Z79899 Other long term (current) drug therapy: Secondary | ICD-10-CM | POA: Diagnosis not present

## 2015-09-11 DIAGNOSIS — E669 Obesity, unspecified: Secondary | ICD-10-CM | POA: Diagnosis not present

## 2015-09-11 DIAGNOSIS — R51 Headache: Secondary | ICD-10-CM

## 2015-09-11 DIAGNOSIS — Z79891 Long term (current) use of opiate analgesic: Secondary | ICD-10-CM

## 2015-09-11 DIAGNOSIS — G4733 Obstructive sleep apnea (adult) (pediatric): Secondary | ICD-10-CM | POA: Diagnosis not present

## 2015-09-11 DIAGNOSIS — R519 Headache, unspecified: Secondary | ICD-10-CM

## 2015-09-11 NOTE — Progress Notes (Signed)
Subjective:    Raymond Trevino ID: Raymond Trevino is a 55 y.o. Trevino.  HPI     Interim history:   Raymond Trevino is a 55 year old right-handed Raymond Trevino with an underlying medical history of hypertension, depression, migraine headaches and obesity, who presents for follow-up consultation of his severe obstructive sleep apnea. The Raymond Trevino is unaccompanied today. I first met him on 01/21/2015 after his sleep studies and we talked about his test results and his compliance data at the time. Raymond Trevino reported difficulty tolerating CPAP, difficulty with of mask, was requesting alternative treatment options and had essentially stopped using his CPAP on October 2016 and never came back for follow-up after his original sleep studies from 2015 until late 2016. I referred him to ENT because Raymond Trevino did not think Raymond Trevino would be able to use CPAP therapy. I referred him to Dr. Redmond Baseman.   Today, 09/11/2015: I reviewed his CPAP compliance data from 08/11/2015 through 09/09/2015 which is a total of 30 days during which time Raymond Trevino used his machine 18 days with percent used days greater than 4 hours at 53%, indicating suboptimal compliance, based primarily on no use it until 08/21/2015. Average usage for all nights was 2 hours and 24 minutes, average usage for nights on machine was 4 hours. AHI 2.7, leak at times high with the 95th percentile at 29.1 L/m on a pressure of 9 cm with EPR of 2.  Today, 09/11/2015: Raymond Trevino reports that Raymond Trevino saw Dr. Redmond Baseman. Raymond Trevino had nasal surgery. Raymond Trevino has restarted using his CPAP recently. Raymond Trevino recently saw Dr. Jaynee Eagles and had a follow-up on 08/20/2015. I reviewed the office note. Raymond Trevino is trying to use it, but still struggling. Raymond Trevino feels, like his headaches are about the same. Raymond Trevino is on hydrocodone about 4 times a week. Raymond Trevino is on topamax 50 mg bid. Raymond Trevino is made aware of the additional complication concerned when patients are on narcotic pain medications and have obstructive sleep apnea. I will also request records from Dr. Redmond Baseman' office.    Previously:  01/21/2015: Raymond Trevino had a baseline sleep study followed by a full night CPAP titration study. I went over his test results with him in detail today. His baseline sleep study from 02/10/2014 showed a sleep efficiency of 77.2% with a latency to sleep of 22.5 minutes and wake after sleep onset of 68 minutes with severe sleep fragmentation noted. Raymond Trevino had an elevated arousal index secondary to respiratory events. Raymond Trevino had an increased percentage of light stage sleep, absence of slow-wave sleep and a decreased percentage of REM sleep at 9% with a REM latency of 67.5 minutes. Raymond Trevino had moderate PLMS with mild arousals. Raymond Trevino had no significant EKG changes or EEG changes. Raymond Trevino had moderate to loud snoring. Total AHI was 33.3 per hour, rising to 43.6 per hour during REM sleep and 40 per hour in the supine position. Supine REM sleep was not achieved. Average oxygen saturation was 92%, nadir was 78% during REM sleep. Based on his test results I invited him back for a full night titration study. Raymond Trevino had this on 04/02/2014. Sleep efficiency was 85% with a latency to sleep of 11.5 minutes and wake after sleep onset of Raymond.5 minutes with mild to moderate sleep fragmentation noted. Raymond Trevino had a normal arousal index. Raymond Trevino had a increased percentage of stage II sleep, absence of slow-wave sleep and REM sleep at 16.2% with a REM latency of 69 minutes. Raymond Trevino had no significant PLMS or EKG or EEG changes. Raymond Trevino slept only on his back.  Average oxygen saturation was 95%, nadir was 89%. CPAP was titrated from 5 cm to 9 cm. AHI was 1.3 per hour at the final pressure with supine REM sleep achieved. Based on his test results are prescribed CPAP therapy for home use. Raymond Trevino did not come back for follow-up.   01/21/2015: I reviewed his CPAP compliance data from 10/21/2014 through 01/18/2015 which is a total of 90 days during which time Raymond Trevino used his machine 35 days with percent used days greater than 4 hours at 38%, indicating poor compliance, average AHI 1  per hour, leak acceptable with the 95th percentile at 11.6 L/m on a pressure of 9 cm with EPR of 2. Average usage for all days only 1 hour and 38 minutes. I reviewed his most recent compliance data from 12/20/2014 through 01/18/2015 which is a total of 30 days during which time Raymond Trevino used his machine only one time, indicating noncompliance.   Raymond Trevino reports that initially Raymond Trevino tried using the machine but Raymond Trevino started having more and more difficulty with sinus infections and nasal congestion. Raymond Trevino says even had to go to the emergency room to be treated for sinusitis. Raymond Trevino was given antibiotics. In the past couple of months his headaches have become worse. Raymond Trevino wakes up with a headache almost every other day. Raymond Trevino has significant nocturia of 4-5 times per night on average. Raymond Trevino does not feel that his headache prevention medication is working. Raymond Trevino is also looking into seeing a pain management doctor as Raymond Trevino has residual hip pain. Raymond Trevino is status post bilateral hip replacement surgeries. Raymond Trevino is on disability. 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 Raymond Trevino has essentially stopped using CPAP in the month of October. Raymond Trevino feels congested all the time. Raymond Trevino has been using Flonase which helps some. Raymond Trevino has a history of year-round allergies. Raymond Trevino has not seen an ENT physician. Raymond Trevino would like to see someone for this. His weight has been stable. Raymond Trevino would like to talk about alternative treatments to CPAP therapy as Raymond Trevino feels Raymond Trevino cannot use it any longer. Raymond Trevino has a nasal mask and has not been in touch with his DME company about alternative masks or other help with CPAP tolerance Raymond Trevino admits. Raymond Trevino drinks caffeine infrequently and drinks alcohol infrequently. Raymond Trevino stopped smoking 10 years ago. Raymond Trevino has grown children. Raymond Trevino lives with his girlfriend.  I reviewed your office note from 08/08/2014. Raymond Trevino indicated compliance with CPAP therapy at the time. Raymond Trevino had residual headaches. Raymond Trevino felt better on CPAP including feeling of better energy.  His Past Medical  History Is Significant For: Past Medical History  Diagnosis Date  . Hypertension   . Migraine   . Depression     His Past Surgical History Is Significant For: Past Surgical History  Procedure Laterality Date  . Hip surgery  2012, 2013  . Wrist surgery  1988    His Family History Is Significant For: Family History  Problem Relation Age of Onset  . Other Father     blunt force trauma  . Migraines Neg Hx     His Social History Is Significant For: Social History   Social History  . Marital Status: Significant Other    Spouse Name: Nadeen Landau  . Number of Children: 3  . Years of Education: HS   Occupational History  .  Other    n/a   Social History Main Topics  . Smoking status: Former Smoker    Quit date: 03/15/2004  .  Smokeless tobacco: Never Used  . Alcohol Use: Yes     Comment: drink every 2 months  . Drug Use: No  . Sexual Activity: Not Asked   Other Topics Concern  . None   Social History Narrative   Pt lives at home with his significant other.   Caffeine Use: Coffee twice a week    His Allergies Are:  No Known Allergies:   His Current Medications Are:  Outpatient Encounter Prescriptions as of 09/11/2015  Medication Sig  . Cetirizine HCl 10 MG TBDP Take 1 tablet by mouth daily.  . fluticasone (FLONASE) 50 MCG/ACT nasal spray Place 2 sprays into both nostrils daily.  Marland Kitchen HYDROcodone-acetaminophen (NORCO/VICODIN) 5-325 MG tablet Take 2 tablets by mouth every 4 (four) hours as needed.  . INVOKAMET 150-500 MG TABS   . losartan-hydrochlorothiazide (HYZAAR) 50-12.5 MG per tablet Take 1 tablet by mouth daily.  Marland Kitchen omeprazole (PRILOSEC) 20 MG capsule Take 1 capsule by mouth daily.  Marland Kitchen PROAIR HFA 108 (90 BASE) MCG/ACT inhaler Inhale 1 puff into the lungs every 6 (six) hours as needed.  . simvastatin (ZOCOR) 10 MG tablet Take 1 tablet by mouth daily.  Marland Kitchen topiramate (TOPAMAX) 50 MG tablet Take 1 tablet (50 mg total) by mouth 2 (two) times daily.  Marland Kitchen zolpidem  (AMBIEN) 10 MG tablet Take 10 mg by mouth 3 (three) times a week.   No facility-administered encounter medications on file as of 09/11/2015.  :  Review of Systems:  Out of a complete 14 point review of systems, all are reviewed and negative with the exception of these symptoms as listed below:   Review of Systems  Neurological:       Raymond Trevino states that Raymond Trevino has just started using his CPAP again. Has not used it due to trouble with headaches. Raymond Trevino feels like Raymond Trevino needs a new mask.     Objective:  Neurologic Exam  Physical Exam Physical Examination:   Filed Vitals:   09/11/15 1441  BP: 115/71  Pulse: 95  Resp: 18   General Examination: The Raymond Trevino is a very pleasant 55 y.o. Trevino in no acute distress. Raymond Trevino appears well-developed and well-nourished and well groomed. Raymond Trevino is obese.   HEENT: Normocephalic, atraumatic, pupils are equal, round and reactive to light and accommodation. Extraocular tracking is good without limitation to gaze excursion or nystagmus noted. Normal smooth pursuit is noted. Hearing is grossly intact. Face is symmetric with normal facial animation and normal facial sensation. Speech is clear with no dysarthria noted. There is no hypophonia. There is no lip, neck/head, jaw or voice tremor. Neck is supple with full range of passive and active motion. There are no carotid bruits on auscultation. Oropharynx exam reveals: mild mouth dryness, adequate dental hygiene and marked airway crowding, due to smaller airway entry, redundant and thick soft palate, large uvula and tonsils in place, 2+ bilaterally. Mallampati is class III. Tongue protrudes centrally and palate elevates symmetrically. Nasal inspection reveals some nasal mucosal bogginess and no redness and no significant septal deviation.   Chest: Clear to auscultation without wheezing, rhonchi or crackles noted.  Heart: S1+S2+0, regular and normal without murmurs, rubs or gallops noted.   Abdomen: Soft, non-tender and  non-distended with normal bowel sounds appreciated on auscultation.  Extremities: There is no pitting edema in the distal lower extremities bilaterally. Pedal pulses are intact.  Skin: Warm and dry without trophic changes noted. There are no varicose veins.  Musculoskeletal: exam reveals no obvious joint deformities, tenderness or joint  swelling or erythema, with the exception of decrease in range of motion in both hips.  Neurologically:  Mental status: The Raymond Trevino is awake, alert and oriented in all 4 spheres. His immediate and remote memory, attention, language skills and fund of knowledge are appropriate. There is no evidence of aphasia, agnosia, apraxia or anomia. Speech is clear with normal prosody and enunciation. Thought process is linear. Mood is normal and affect is normal.  Cranial nerves II - XII are as described above under HEENT exam. In addition: shoulder shrug is normal with equal shoulder height noted. Motor exam: Normal bulk, strength and tone is noted. There is no drift, tremor or rebound. Romberg is negative. Reflexes are 1+ throughout. Fine motor skills and coordination: intact.  Sensory exam: intact to light touch in the upper and lower extremities.  Gait, station and balance: Raymond Trevino stands with mild difficulty. No veering to one side is noted. No leaning to one side is noted. Posture is age-appropriate and stance is narrow based. Gait shows normal stride length and normal pace. No problems turning are noted. Tandem walk is unremarkable.                Assessment and Plan:   In summary, Raymond Trevino is a very pleasant 55 year old Trevino with an underlying medical history of hypertension, depression, migraine headaches and obesity, s/p b/l THR (2012 and 2013), on chronic narcotic pain medication, who presents for follow-up consultation of his severe obstructive sleep apnea. I saw him once before after his 2 sleep studies in November 2015 and January 2016, respectively. at that time  Raymond Trevino reported difficulty with CPAP and inability to tolerate it. I referred him to ENT, Dr. Redmond Baseman. Raymond Trevino has in the interim had nasal surgery, no sleep apnea surgery in addition. Raymond Trevino still has nasal congestion Raymond Trevino reports but has started using CPAP on 08/22/2015 with fairly good compliance. In addition, Raymond Trevino is advised about his 2 sleep study reports again today and we compared findings before and after CPAP therapy. Furthermore, I strongly advised him not to skip any nights and sleep with CPAP through the night as best as possible because Raymond Trevino also takes narcotic pain medication on a nearly daily basis and this puts him at higher risk for complications including prolonged apneas and respiratory depression. Raymond Trevino is reporting ongoing recurrent headaches. Raymond Trevino has been tried on amitriptyline and nortriptyline as I understand. Raymond Trevino is currently on topiramate and this may be something that could be increased in the future. I advised him to discuss this with Dr. Jaynee Eagles as well. Raymond Trevino reports that Raymond Trevino is hoping to get a referral to a head trauma center which Raymond Trevino and Dr. Jaynee Eagles discussed. I will ask her about it as Raymond Trevino was inquiring about the status of the referral. From my end of things, I will see him back in 6 months and yearly thereafter so long Raymond Trevino is compliant with treatment. I answered all his questions today and the Raymond Trevino was in agreement. We talked about the risks and ramifications of untreated severe obstructive sleep apnea, especially with respect to developing cardiovascular disease down the Road, including congestive heart failure, difficult to treat hypertension, cardiac arrhythmias, or stroke. Even type 2 diabetes has, in part, been linked to untreated OSA. Symptoms of untreated OSA include daytime sleepiness, memory problems, mood irritability and mood disorder such as depression and anxiety, lack of energy, as well as recurrent headaches, especially morning headaches. We talked about trying to maintain a healthy lifestyle in  general, as well as the importance of weight control.  I explained the importance of being compliant with PAP treatment, not only for insurance purposes but primarily to improve His symptoms, and for the Raymond Trevino's long term health benefit, including to reduce His cardiovascular risks. I spent 25 minutes in total face-to-face time with the Raymond Trevino, more than 50% of which was spent in counseling and coordination of care, reviewing test results, reviewing medication and discussing or reviewing the diagnosis of OSA. its prognosis and treatment options.

## 2015-09-11 NOTE — Patient Instructions (Signed)
Please continue using your CPAP regularly. While your insurance requires that you use CPAP at least 4 hours each night on 70% of the nights, I recommend, that you not skip any nights and use it throughout the night if you can. Getting used to CPAP and staying with the treatment long term does take time and patience and discipline. Untreated obstructive sleep apnea when it is moderate to severe can have an adverse impact on cardiovascular health and raise her risk for heart disease, arrhythmias, hypertension, congestive heart failure, stroke and diabetes. Untreated obstructive sleep apnea causes sleep disruption, nonrestorative sleep, and sleep deprivation. This can have an impact on your day to day functioning and cause daytime sleepiness and impairment of cognitive function, memory loss, mood disturbance, and problems focussing. Using CPAP regularly can improve these symptoms.  I will see you back in 6 months for sleep apnea check up, and if you continue to do well on CPAP I will see you once a year thereafter.   Please be aware, that your complication risk is higher when you have sleep apnea and take narcotic pain medication. Therefore, do not skip any nights and use your CPAP through the night.   You may talk to Dr. Lucia GaskinsAhern about increasing the topiramate.

## 2015-10-21 ENCOUNTER — Ambulatory Visit (INDEPENDENT_AMBULATORY_CARE_PROVIDER_SITE_OTHER): Payer: Medicare HMO

## 2015-10-21 ENCOUNTER — Ambulatory Visit (HOSPITAL_COMMUNITY)
Admission: EM | Admit: 2015-10-21 | Discharge: 2015-10-21 | Disposition: A | Discharge status: Home / Self Care | Payer: Medicare HMO | Attending: Family Medicine | Admitting: Family Medicine

## 2015-10-21 ENCOUNTER — Encounter (HOSPITAL_COMMUNITY): Payer: Self-pay | Admitting: *Deleted

## 2015-10-21 DIAGNOSIS — M25571 Pain in right ankle and joints of right foot: Secondary | ICD-10-CM

## 2015-10-21 MED ORDER — NAPROXEN 500 MG PO TABS: 500.0000 mg | ORAL_TABLET | Freq: Two times a day (BID) | ORAL | 1 refills | Status: AC

## 2015-10-21 NOTE — ED Triage Notes (Signed)
Pt  Reports  Pain  r  Ankle   Worse on  Weight  Bearing  denys  Any  inj

## 2015-10-21 NOTE — ED Notes (Signed)
X  Large     aso     brace

## 2015-10-21 NOTE — ED Provider Notes (Signed)
[facility]    CSN: [id number] Arrival date & time: 10/21/15  1356  First Provider Contact:  First MD Initiated Contact with Patient 10/21/15 1427        History   Chief Complaint Chief Complaint  Patient presents with  . Ankle Pain    HPI Raymond Trevino is a 55 y.o. male.    Ankle Pain  Location:  Ankle Time since incident:  2 days Injury: no   Ankle location:  R ankle Pain details:    Quality:  Throbbing   Severity:  Moderate   Onset quality:  Sudden   Duration:  2 days   Timing:  Constant   Progression:  Unchanged Chronicity:  New Dislocation: no   Prior injury to area:  No Relieved by:  None tried Ineffective treatments:  None tried Associated symptoms: decreased ROM and stiffness   Associated symptoms: no fever   Risk factors: obesity   Risk factors comment:  S/p bilat hip replacement for arthritis.   Past Medical History:  Diagnosis Date  . Depression   . Hypertension   . Migraine     Patient Active Problem List   Diagnosis Date Noted  . Non-compliance 08/20/2015  . Medication overuse headache 06/12/2015  . Obesity 06/12/2015  . OSA (obstructive sleep apnea) 04/01/2014  . Other headache syndrome 12/26/2013  . Snoring 12/26/2013  . Hypersomnia 12/26/2013  . Cognitive complaints 12/26/2013  . History of suicide attempt 12/26/2013  . Suicidal thoughts 12/26/2013  . Morbid obesity (HCC) 12/26/2013    Past Surgical History:  Procedure Laterality Date  . HIP SURGERY  2012, 2013  . WRIST SURGERY  1988       Home Medications    Prior to Admission medications   Medication Sig Start Date End Date Taking? Authorizing Provider  Cetirizine HCl 10 MG TBDP Take 1 tablet by mouth daily.    Historical Provider, MD  fluticasone (FLONASE) 50 MCG/ACT nasal spray Place 2 sprays into both nostrils daily. 12/05/13   Historical Provider, MD  HYDROcodone-acetaminophen (NORCO/VICODIN) 5-325 MG tablet Take 2 tablets by mouth every 4 (four) hours  as needed. 12/15/14   Danelle Berry, PA-C  INVOKAMET 150-500 MG TABS  12/19/14   Historical Provider, MD  losartan-hydrochlorothiazide (HYZAAR) 50-12.5 MG per tablet Take 1 tablet by mouth daily. 11/28/13   Historical Provider, MD  naproxen (NAPROSYN) 500 MG tablet Take 1 tablet (500 mg total) by mouth 2 (two) times daily. 10/21/15   Linna Hoff, MD  omeprazole (PRILOSEC) 20 MG capsule Take 1 capsule by mouth daily. 11/28/13   Historical Provider, MD  PROAIR HFA 108 (90 BASE) MCG/ACT inhaler Inhale 1 puff into the lungs every 6 (six) hours as needed. 11/26/13   Historical Provider, MD  simvastatin (ZOCOR) 10 MG tablet Take 1 tablet by mouth daily. 12/17/13   Historical Provider, MD  topiramate (TOPAMAX) 50 MG tablet Take 1 tablet (50 mg total) by mouth 2 (two) times daily. 08/08/14   Anson Fret, MD  zolpidem (AMBIEN) 10 MG tablet Take 10 mg by mouth 3 (three) times a week. 08/18/15   Historical Provider, MD    Family History Family History  Problem Relation Age of Onset  . Other Father     blunt force trauma  . Migraines Neg Hx     Social History Social History  Substance Use Topics  . Smoking status: Former Smoker    Quit date: 03/15/2004  . Smokeless tobacco: Never Used  .  Alcohol use Yes     Comment: drink every 2 months     Allergies   Review of patient's allergies indicates no known allergies.   Review of Systems Review of Systems  Constitutional: Negative for fever.  Musculoskeletal: Positive for stiffness.     Physical Exam Triage Vital Signs ED Triage Vitals  Enc Vitals Group     BP 10/21/15 1411 116/79     Pulse Rate 10/21/15 1411 89     Resp 10/21/15 1411 12     Temp 10/21/15 1411 98.1 F (36.7 C)     Temp Source 10/21/15 1411 Oral     SpO2 10/21/15 1411 96 %     Weight --      Height --      Head Circumference --      Peak Flow --      Pain Score 10/21/15 1416 8     Pain Loc --      Pain Edu? --      Excl. in GC? --    No data found.   Updated Vital  Signs BP 116/79 (BP Location: Left Arm)   Pulse 89   Temp 98.1 F (36.7 C) (Oral)   Resp 12   SpO2 96%   Visual Acuity Right Eye Distance:   Left Eye Distance:   Bilateral Distance:    Right Eye Near:   Left Eye Near:    Bilateral Near:     Physical Exam   UC Treatments / Results  Labs (all labs ordered are listed, but only abnormal results are displayed) Labs Reviewed - No data to display  EKG  EKG Interpretation None       Radiology Dg Ankle Complete Right  Result Date: 10/21/2015 CLINICAL DATA:  Woke up with right ankle pain and swelling, no known injury, diabetes EXAM: RIGHT ANKLE - COMPLETE 3+ VIEW COMPARISON:  None. FINDINGS: No acute fracture is seen. The ankle joint appears normal and alignment is normal. A well corticated bony density near the medial malleolus may be due to prior trauma. There are degenerative calcaneal spurs present. IMPRESSION: Degenerative change.  No acute abnormality. Electronically Signed   By: Dwyane DeePaul  Barry M.D.   On: 10/21/2015 14:49    Procedures Procedures (including critical care time)  Medications Ordered in UC Medications - No data to display   Initial Impression / Assessment and Plan / UC Course  I have reviewed the triage vital signs and the nursing notes.  Pertinent labs & imaging results that were available during my care of the patient were reviewed by me and considered in my medical decision making (see chart for details).  Clinical Course  Value Comment By Time  DG Ankle Complete Right (Reviewed) Linna HoffJames D Kindl, MD 08/08 1536      Final Clinical Impressions(s) / UC Diagnoses   Final diagnoses:  Right ankle pain    New Prescriptions Discharge Medication List as of 10/21/2015  3:49 PM    START taking these medications   Details  naproxen (NAPROSYN) 500 MG tablet Take 1 tablet (500 mg total) by mouth 2 (two) times daily., Starting Tue 10/21/2015, Print         Linna HoffJames D Kindl, MD 10/21/15 [phone]

## 2015-10-22 ENCOUNTER — Telehealth: Payer: Self-pay | Admitting: *Deleted

## 2015-10-22 NOTE — Telephone Encounter (Signed)
Called and spoke to pt. He called and spoke to Dr Delsa Granaurgin who will be faxing over results report as requested either today or tomorrow.  Patient requested I call him once I receive fax.

## 2015-10-22 NOTE — Telephone Encounter (Signed)
Patient is calling back for a returned call but would not go into detail why he is calling.

## 2015-10-22 NOTE — Telephone Encounter (Addendum)
Called Raymond Trevino. He dropped of EEG results to office today done by a Dr Delsa Granaurgin. Gerome ApleyEmily E. From front desk came and handed me results. There was no report with results. No date of when test was done. Per Dr Lucia GaskinsAhern we need report, when test was done and where.   I called Raymond Trevino and relayed message. He is going to call office he completed EEG at and see if they can fax everything we are requesting. I gave him fax [phone] to fax info to. Advised him to call back if he has any other questions. He verbalized understanding.

## 2015-10-27 NOTE — Telephone Encounter (Signed)
Called and spoke to pt. Advised I have not received report yet. He states he spoke to Dr Delsa Granaurgin again this morning and he will call back and let them know Dr Lucia GaskinsAhern is requesting report with EEG results. This is the interpretation of EEG that she needs. He verbalized understanding. He will call Dr Delsa Granaurgin back and let her know.

## 2015-10-30 NOTE — Telephone Encounter (Signed)
Per our office policy we do not refer patients anymore, all new referrals need to come from his primary care thanks

## 2015-10-30 NOTE — Telephone Encounter (Signed)
Dr Lucia GaskinsAhern- Please advise  Called and spoke to patient. Advised I have not yet received report from Dr Delsa Granaurgin. He stated that he has spoken to her 3-4 times and states "she is not being compliant for me".  He states she told him "she is privately owned and does not deal with insurance companies". Patient requesting if he can get referral from Dr Lucia GaskinsAhern to go to a different doctor to be evaluated similar to Dr Delsa Granaurgin. He is unable to get a report per Dr Lucia GaskinsAhern request at this time. He stated "I do not want to waste your time or Dr Trevor MaceAhern's time at this point". He stated he did not think it would be this hard to get a report.  Advised I will speak to Dr Lucia GaskinsAhern and call back tomorrow at the latest to advise. He verbalized understanding.

## 2015-10-31 NOTE — Telephone Encounter (Signed)
Patient called office back and spoke to phone staff, [name] She relayed Dr Trevor MaceAhern's message. Pt verbalized understanding .Raymond notified me that she spoke to pt.

## 2015-10-31 NOTE — Telephone Encounter (Signed)
Called and LVM for pt. Gave [facility] phone number.  **Please relay Dr Trevor MaceAhern's message below if he calls, thank you.

## 2015-12-15 NOTE — Telephone Encounter (Signed)
Dr Lucia GaskinsAhern- I received a copy of report from Dr Delsa Granaurgin that we were trying to obtain previously. I put with your notes from today to review, thank you

## 2016-01-21 NOTE — Telephone Encounter (Signed)
Pt called in asking for a copy of his report from Dr. Delsa Granaurgin. Please call and advise [phone]

## 2016-01-21 NOTE — Telephone Encounter (Signed)
Dr Lucia GaskinsAhern- do you still have copy of the report?

## 2016-01-22 ENCOUNTER — Telehealth: Payer: Self-pay | Admitting: *Deleted

## 2016-01-22 NOTE — Telephone Encounter (Signed)
Pt medical records mailed out on 01/22/16.

## 2016-01-22 NOTE — Telephone Encounter (Signed)
error 

## 2016-01-22 NOTE — Telephone Encounter (Signed)
Pt called, he is not sure who called him. I advised him I would leave a msg and he would rec' a return call

## 2016-01-22 NOTE — Telephone Encounter (Signed)
Raymond Trevino- can you mail pt copy of report please and let him know? I will bring this to you, thank you!

## 2016-03-18 ENCOUNTER — Telehealth: Payer: Self-pay

## 2016-03-18 ENCOUNTER — Ambulatory Visit: Payer: Medicare HMO | Admitting: Neurology

## 2016-03-18 NOTE — Telephone Encounter (Signed)
Patient called and cancelled appt same day due to weather issues.

## 2016-06-03 ENCOUNTER — Ambulatory Visit: Payer: Medicare HMO | Admitting: Neurology

## 2016-08-19 ENCOUNTER — Ambulatory Visit: Payer: Medicare HMO | Admitting: Neurology

## 2017-02-15 IMAGING — DX DG ANKLE COMPLETE 3+V*R*
3 series · 3 of 3 positions shown · non-contrast
Comparison: None.

CLINICAL DATA: Woke up with right ankle pain and swelling, no known
injury, diabetes

EXAM:
RIGHT ANKLE - COMPLETE 3+ VIEW

[ankle ap]
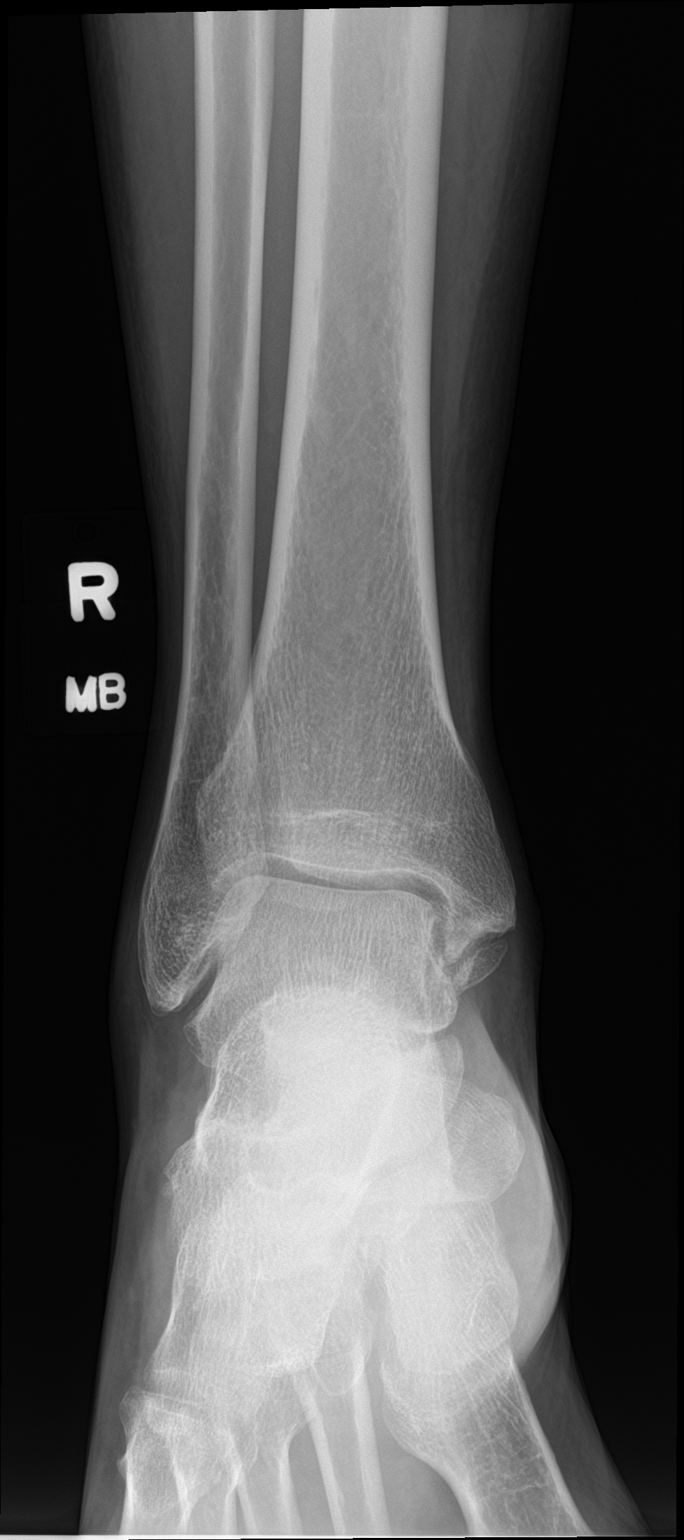

[ankle obl]
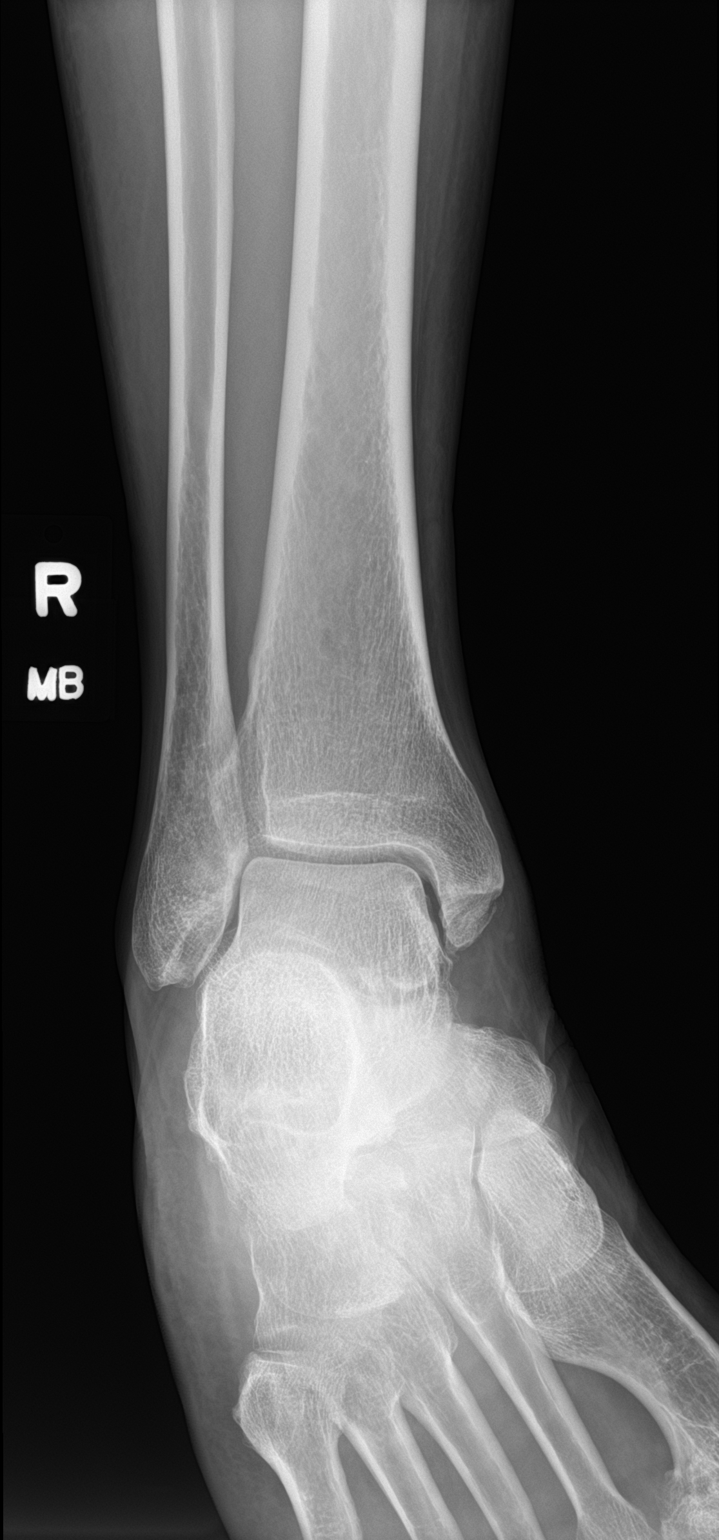

[ankle lat]
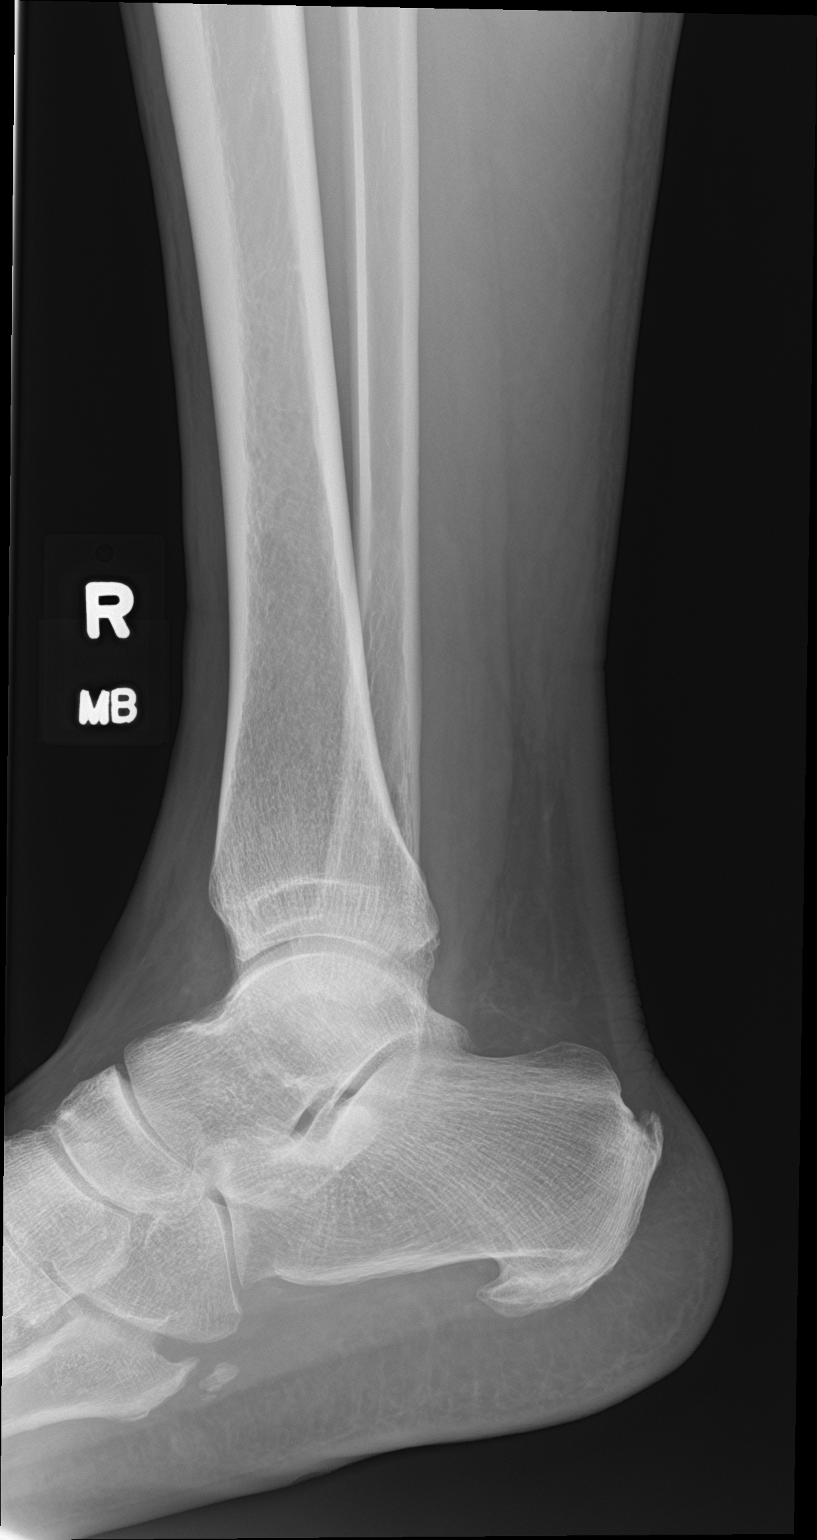

[3 of 3 positions shown; findings below may reference images not displayed]

FINDINGS: No acute fracture is seen. The ankle joint appears normal and
alignment is normal. A well corticated bony density near the medial
malleolus may be due to prior trauma. There are degenerative
calcaneal spurs present.
IMPRESSION: Degenerative change.  No acute abnormality.

## 2017-12-26 ENCOUNTER — Encounter (HOSPITAL_COMMUNITY): Payer: Self-pay

## 2017-12-26 ENCOUNTER — Other Ambulatory Visit: Payer: Self-pay

## 2017-12-26 ENCOUNTER — Emergency Department (HOSPITAL_COMMUNITY)
Admission: EM | Admit: 2017-12-26 | Discharge: 2017-12-26 | Disposition: A | Discharge status: Home / Self Care | Payer: Medicare HMO | Source: Home / Self Care | Attending: Emergency Medicine | Admitting: Emergency Medicine

## 2017-12-26 ENCOUNTER — Emergency Department (HOSPITAL_COMMUNITY): Payer: Medicare HMO

## 2017-12-26 DIAGNOSIS — I10 Essential (primary) hypertension: Secondary | ICD-10-CM

## 2017-12-26 DIAGNOSIS — Z87891 Personal history of nicotine dependence: Secondary | ICD-10-CM | POA: Insufficient documentation

## 2017-12-26 DIAGNOSIS — R1011 Right upper quadrant pain: Secondary | ICD-10-CM | POA: Diagnosis not present

## 2017-12-26 DIAGNOSIS — R1084 Generalized abdominal pain: Secondary | ICD-10-CM | POA: Insufficient documentation

## 2017-12-26 DIAGNOSIS — K8000 Calculus of gallbladder with acute cholecystitis without obstruction: Secondary | ICD-10-CM | POA: Diagnosis not present

## 2017-12-26 DIAGNOSIS — Z79899 Other long term (current) drug therapy: Secondary | ICD-10-CM | POA: Insufficient documentation

## 2017-12-26 DIAGNOSIS — R0602 Shortness of breath: Secondary | ICD-10-CM

## 2017-12-26 LAB — CBC WITH DIFFERENTIAL/PLATELET
Abs Immature Granulocytes: 0.07 10*3/uL (ref 0.00–0.07)
BASOS ABS: 0 10*3/uL (ref 0.0–0.1)
BASOS PCT: 0 %
EOS ABS: 0 10*3/uL (ref 0.0–0.5)
Eosinophils Relative: 0 %
HCT: 51.5 % (ref 39.0–52.0)
Hemoglobin: 16.4 g/dL (ref 13.0–17.0)
IMMATURE GRANULOCYTES: 1 %
Lymphocytes Relative: 15 %
Lymphs Abs: 2 10*3/uL (ref 0.7–4.0)
MCH: 29.4 pg (ref 26.0–34.0)
MCHC: 31.8 g/dL (ref 30.0–36.0)
MCV: 92.3 fL (ref 80.0–100.0)
Monocytes Absolute: 0.8 10*3/uL (ref 0.1–1.0)
Monocytes Relative: 6 %
NEUTROS PCT: 78 %
NRBC: 0 % (ref 0.0–0.2)
Neutro Abs: 10.2 10*3/uL — ABNORMAL HIGH (ref 1.7–7.7)
PLATELETS: 312 10*3/uL (ref 150–400)
RBC: 5.58 MIL/uL (ref 4.22–5.81)
RDW: 14.5 % (ref 11.5–15.5)
WBC: 13.1 10*3/uL — AB (ref 4.0–10.5)

## 2017-12-26 LAB — I-STAT CHEM 8, ED
BUN: 13 mg/dL (ref 6–20)
Calcium, Ion: 1.02 mmol/L — ABNORMAL LOW (ref 1.15–1.40)
Chloride: 100 mmol/L (ref 98–111)
Creatinine, Ser: 1 mg/dL (ref 0.61–1.24)
GLUCOSE: 250 mg/dL — AB (ref 70–99)
HCT: 53 % — ABNORMAL HIGH (ref 39.0–52.0)
HEMOGLOBIN: 18 g/dL — AB (ref 13.0–17.0)
POTASSIUM: 3.6 mmol/L (ref 3.5–5.1)
Sodium: 139 mmol/L (ref 135–145)
TCO2: 28 mmol/L (ref 22–32)

## 2017-12-26 LAB — URINALYSIS, ROUTINE W REFLEX MICROSCOPIC
BACTERIA UA: NONE SEEN
BILIRUBIN URINE: NEGATIVE
Glucose, UA: >=500 mg/dL — AB
Ketones, ur: 5 mg/dL — AB
Leukocytes, UA: NEGATIVE
NITRITE: NEGATIVE
PH: 7 (ref 5.0–8.0)
Protein, ur: 100 mg/dL — AB
Specific Gravity, Urine: 1.025 (ref 1.005–1.030)

## 2017-12-26 LAB — HEPATIC FUNCTION PANEL
ALT: 45 U/L — ABNORMAL HIGH (ref 0–44)
AST: 26 U/L (ref 15–41)
Albumin: 4.5 g/dL (ref 3.5–5.0)
Alkaline Phosphatase: 71 U/L (ref 38–126)
BILIRUBIN DIRECT: 0.2 mg/dL (ref 0.0–0.2)
BILIRUBIN INDIRECT: 0.4 mg/dL (ref 0.3–0.9)
BILIRUBIN TOTAL: 0.6 mg/dL (ref 0.3–1.2)
Total Protein: 9 g/dL — ABNORMAL HIGH (ref 6.5–8.1)

## 2017-12-26 LAB — LIPASE, BLOOD: Lipase: 23 U/L (ref 11–51)

## 2017-12-26 LAB — RAPID URINE DRUG SCREEN, HOSP PERFORMED
Amphetamines: NOT DETECTED
BARBITURATES: NOT DETECTED
BENZODIAZEPINES: NOT DETECTED
COCAINE: NOT DETECTED
Opiates: POSITIVE — AB
TETRAHYDROCANNABINOL: NOT DETECTED

## 2017-12-26 LAB — BRAIN NATRIURETIC PEPTIDE: B NATRIURETIC PEPTIDE 5: 20.3 pg/mL (ref 0.0–100.0)

## 2017-12-26 LAB — I-STAT TROPONIN, ED: TROPONIN I, POC: 0 ng/mL (ref 0.00–0.08)

## 2017-12-26 MED ORDER — IOPAMIDOL (ISOVUE-370) INJECTION 76%
INTRAVENOUS | Status: AC
  Filled 2017-12-26: qty 100

## 2017-12-26 MED ORDER — DICYCLOMINE HCL 20 MG PO TABS: 20.0000 mg | ORAL_TABLET | Freq: Two times a day (BID) | ORAL | 0 refills | Status: AC

## 2017-12-26 MED ORDER — KETOROLAC TROMETHAMINE 30 MG/ML IJ SOLN
30.0000 mg | Freq: Once | INTRAMUSCULAR | Status: AC
  Administered 2017-12-26: 30 mg via INTRAVENOUS
  Filled 2017-12-26: qty 1

## 2017-12-26 MED ORDER — DICYCLOMINE HCL 10 MG/ML IM SOLN
20.0000 mg | Freq: Once | INTRAMUSCULAR | Status: AC
  Administered 2017-12-26: 20 mg via INTRAMUSCULAR
  Filled 2017-12-26: qty 2

## 2017-12-26 MED ORDER — IOPAMIDOL (ISOVUE-370) INJECTION 76%
100.0000 mL | Freq: Once | INTRAVENOUS | Status: AC | PRN
  Administered 2017-12-26: 100 mL via INTRAVENOUS

## 2017-12-26 NOTE — ED Provider Notes (Addendum)
[facility] ([place] EMERGENCY DEPARTMENT Provider Note   CSN: [id number] Arrival date & time: 12/26/17  0454     History   Chief Complaint Chief Complaint  Patient presents with  . Chest Pain  . Abdominal Pain    HPI Raymond Trevino is a 57 y.o. male.  The history is provided by the patient. No language interpreter was used.  Chest Pain   This is a new problem. The current episode started more than 2 days ago. The problem occurs constantly. The problem has not changed since onset.The pain is associated with rest. The pain is present in the epigastric region. The pain is at a severity of 10/10. The pain is severe. Radiates to: entire abdomen   Duration of episode(s) is 2 days. Associated symptoms include abdominal pain and nausea. Pertinent negatives include no diaphoresis, no fever, no palpitations, no PND, no shortness of breath and no sputum production. Treatments tried: is on home narcotics  The treatment provided no relief. Risk factors include male gender and obesity.  Pertinent negatives for past medical history include no aneurysm, no MI, no mitral valve prolapse and no pacemaker.  Pertinent negatives for family medical history include: no aortic dissection and no Marfan's syndrome.  Procedure history is negative for cardiac catheterization.  Abdominal Pain   Associated symptoms include nausea. Pertinent negatives include fever and diarrhea.    Past Medical History:  Diagnosis Date  . Depression   . Hypertension   . Migraine     Patient Active Problem List   Diagnosis Date Noted  . Non-compliance 08/20/2015  . Medication overuse headache 06/12/2015  . Obesity 06/12/2015  . OSA (obstructive sleep apnea) 04/01/2014  . Other headache syndrome 12/26/2013  . Snoring 12/26/2013  . Hypersomnia 12/26/2013  . Cognitive complaints 12/26/2013  . History of suicide attempt 12/26/2013  . Suicidal thoughts 12/26/2013  . Morbid obesity (HCC) 12/26/2013    Past  Surgical History:  Procedure Laterality Date  . HIP SURGERY  2012, 2013  . WRIST SURGERY  1988        Home Medications    Prior to Admission medications   Medication Sig Start Date End Date Taking? Authorizing Provider  Cetirizine HCl 10 MG TBDP Take 1 tablet by mouth daily.    [provider]  fluticasone (FLONASE) 50 MCG/ACT nasal spray Place 2 sprays into both nostrils daily. 12/05/13   [provider]  HYDROcodone-acetaminophen (NORCO/VICODIN) 5-325 MG tablet Take 2 tablets by mouth every 4 (four) hours as needed. 12/15/14   Danelle Berry, PA-C  INVOKAMET 150-500 MG TABS  12/19/14   [provider]  losartan-hydrochlorothiazide (HYZAAR) 50-12.5 MG per tablet Take 1 tablet by mouth daily. 11/28/13   [provider]  naproxen (NAPROSYN) 500 MG tablet Take 1 tablet (500 mg total) by mouth 2 (two) times daily. 10/21/15   Linna Hoff, MD  omeprazole (PRILOSEC) 20 MG capsule Take 1 capsule by mouth daily. 11/28/13   [provider]  PROAIR HFA 108 (90 BASE) MCG/ACT inhaler Inhale 1 puff into the lungs every 6 (six) hours as needed. 11/26/13   [provider]  simvastatin (ZOCOR) 10 MG tablet Take 1 tablet by mouth daily. 12/17/13   [provider]  topiramate (TOPAMAX) 50 MG tablet Take 1 tablet (50 mg total) by mouth 2 (two) times daily. 08/08/14   Anson Fret, MD  zolpidem (AMBIEN) 10 MG tablet Take 10 mg by mouth 3 (three) times a week. 08/18/15  [provider]    Family History Family History  Problem Relation Age of Onset  . Other Father        blunt force trauma  . Migraines Neg Hx     Social History Social History   Tobacco Use  . Smoking status: Former Smoker    Last attempt to quit: 03/15/2004    Years since quitting: 13.7  . Smokeless tobacco: Never Used  Substance Use Topics  . Alcohol use: Yes    Comment: drink every 2 months  . Drug use: No     Allergies   Patient has no known  allergies.   Review of Systems Review of Systems  Constitutional: Negative for diaphoresis and fever.  Respiratory: Negative for sputum production and shortness of breath.   Cardiovascular: Positive for chest pain. Negative for palpitations, leg swelling and PND.  Gastrointestinal: Positive for abdominal pain and nausea. Negative for diarrhea.  All other systems reviewed and are negative.    Physical Exam Updated Vital Signs BP (!) 144/91   Pulse (!) 106   Temp 98.4 F (36.9 C) (Oral)   Resp (!) 29   SpO2 97%   Physical Exam  Constitutional: He is oriented to person, place, and time. He appears well-developed and well-nourished. No distress.  HENT:  Head: Normocephalic and atraumatic.  Mouth/Throat: Oropharynx is clear and moist. No oropharyngeal exudate.  Eyes: Pupils are equal, round, and reactive to light. Conjunctivae are normal.  Neck: Normal range of motion. Neck supple.  Cardiovascular: Normal rate, regular rhythm, normal heart sounds and intact distal pulses.  Pulmonary/Chest: Effort normal and breath sounds normal. No stridor. He has no wheezes. He has no rales.  Abdominal: Soft. He exhibits no mass. Bowel sounds are increased. There is no tenderness. There is no rigidity, no rebound, no guarding, no tenderness at McBurney's point and negative Murphy's sign. No hernia.  Musculoskeletal: Normal range of motion.  Neurological: He is alert and oriented to person, place, and time. He displays normal reflexes.  Skin: Skin is warm and dry. Capillary refill takes less than 2 seconds.     ED Treatments / Results  Labs (all labs ordered are listed, but only abnormal results are displayed) Results for orders placed or performed during the hospital encounter of 12/26/17  CBC with Differential/Platelet  Result Value Ref Range   WBC 13.1 (H) 4.0 - 10.5 K/uL   RBC 5.58 4.22 - 5.81 MIL/uL   Hemoglobin 16.4 13.0 - 17.0 g/dL   HCT 16.1 09.6 - 04.5 %   MCV 92.3 80.0 - 100.0 fL    MCH 29.4 26.0 - 34.0 pg   MCHC 31.8 30.0 - 36.0 g/dL   RDW 40.9 81.1 - 91.4 %   Platelets 312 150 - 400 K/uL   nRBC 0.0 0.0 - 0.2 %   Neutrophils Relative % 78 %   Neutro Abs 10.2 (H) 1.7 - 7.7 K/uL   Lymphocytes Relative 15 %   Lymphs Abs 2.0 0.7 - 4.0 K/uL   Monocytes Relative 6 %   Monocytes Absolute 0.8 0.1 - 1.0 K/uL   Eosinophils Relative 0 %   Eosinophils Absolute 0.0 0.0 - 0.5 K/uL   Basophils Relative 0 %   Basophils Absolute 0.0 0.0 - 0.1 K/uL   Immature Granulocytes 1 %   Abs Immature Granulocytes 0.07 0.00 - 0.07 K/uL  Hepatic function panel  Result Value Ref Range   Total Protein 9.0 (H) 6.5 - 8.1 g/dL   Albumin 4.5  3.5 - 5.0 g/dL   AST 26 15 - 41 U/L   ALT 45 (H) 0 - 44 U/L   Alkaline Phosphatase 71 38 - 126 U/L   Total Bilirubin 0.6 0.3 - 1.2 mg/dL   Bilirubin, Direct 0.2 0.0 - 0.2 mg/dL   Indirect Bilirubin 0.4 0.3 - 0.9 mg/dL  Lipase, blood  Result Value Ref Range   Lipase 23 11 - 51 U/L  Brain natriuretic peptide  Result Value Ref Range   B Natriuretic Peptide 20.3 0.0 - 100.0 pg/mL  I-Stat Chem 8, ED  Result Value Ref Range   Sodium 139 135 - 145 mmol/L   Potassium 3.6 3.5 - 5.1 mmol/L   Chloride 100 98 - 111 mmol/L   BUN 13 6 - 20 mg/dL   Creatinine, Ser 1.61 0.61 - 1.24 mg/dL   Glucose, Bld 096 (H) 70 - 99 mg/dL   Calcium, Ion 0.45 (L) 1.15 - 1.40 mmol/L   TCO2 28 22 - 32 mmol/L   Hemoglobin 18.0 (H) 13.0 - 17.0 g/dL   HCT 40.9 (H) 81.1 - 91.4 %  I-stat troponin, ED  Result Value Ref Range   Troponin i, poc 0.00 0.00 - 0.08 ng/mL   Comment 3           Dg Chest Portable 1 View  Result Date: 12/26/2017 CLINICAL DATA:  Left lower abdominal pain, shortness of breath, and central chest pain for 2 days. EXAM: PORTABLE CHEST 1 VIEW COMPARISON:  None. FINDINGS: Shallow inspiration. The heart size and mediastinal contours are within normal limits. Both lungs are clear. The visualized skeletal structures are unremarkable. IMPRESSION: No active  disease. Electronically Signed   By: Burman Nieves M.D.   On: 12/26/2017 05:29   Ct Angio Chest/abd/pel For Dissection W And/or Wo Contrast  Result Date: 12/26/2017 CLINICAL DATA:  Shortness of breath and chest pain EXAM: CT ANGIOGRAPHY CHEST, ABDOMEN AND PELVIS TECHNIQUE: Multidetector CT imaging through the chest, abdomen and pelvis was performed using the standard protocol during bolus administration of intravenous contrast. Multiplanar reconstructed images and MIPs were obtained and reviewed to evaluate the vascular anatomy. CONTRAST:  ISOVUE-370 IOPAMIDOL (ISOVUE-370) INJECTION 76% COMPARISON:  None. FINDINGS: CTA CHEST FINDINGS Cardiovascular: --Heart: The heart size is normal.  There is nopericardial effusion. --Aorta: The course and caliber of the thoracic aorta are normal. There is no aortic atherosclerotic calcification. Precontrast images show no aortic intramural hematoma. There is no blood pool, dissection or penetrating ulcer demonstrated on arterial phase postcontrast imaging. There is a conventional 3 vessel aortic arch branching pattern. The proximal arch vessels are widely patent. --Pulmonary Arteries: Contrast timing is optimized for preferential opacification of the aorta. Within that limitation, normal central pulmonary arteries. Mediastinum/Nodes: No mediastinal, hilar or axillary lymphadenopathy. The visualized thyroid and thoracic esophageal course are unremarkable. Lungs/Pleura: No pulmonary nodules or masses. No pleural effusion or pneumothorax. No focal airspace consolidation. No focal pleural abnormality. Musculoskeletal: No chest wall abnormality. No acute osseous findings. Review of the MIP images confirms the above findings. CTA ABDOMEN AND PELVIS FINDINGS VASCULAR Aorta: Normal caliber aorta without aneurysm, dissection, vasculitis or hemodynamically significant stenosis. There is no aortic atherosclerosis. Celiac: No aneurysm, dissection or hemodynamically significant  stenosis. Normal branching pattern. SMA: Widely patent without dissection or stenosis. Renals: Single renal arteries bilaterally. No aneurysm, dissection, stenosis or evidence of fibromuscular dysplasia. IMA: Patent without abnormality. Inflow: No aneurysm, stenosis or dissection. Veins: Normal course and caliber of the major veins. Assessment is otherwise limited by  the arterial dominant contrast phase. Review of the MIP images confirms the above findings. NON-VASCULAR Hepatobiliary: Left hepatic lobe hemangioma measures 4.2 cm. Normal gallbladder. Pancreas: Normal contours without ductal dilatation. No peripancreatic fluid collection. Spleen: Normal arterial phase splenic enhancement pattern. Adrenals/Urinary Tract: --Adrenal glands: Normal. --Right kidney/ureter: No hydronephrosis or perinephric stranding. No nephrolithiasis. No obstructing ureteral stones. --Left kidney/ureter: No hydronephrosis or perinephric stranding. No nephrolithiasis. No obstructing ureteral stones. --Urinary bladder: Unremarkable. Stomach/Bowel: --Stomach/Duodenum: No hiatal hernia or other gastric abnormality. Normal duodenal course and caliber. --Small bowel: No dilatation or inflammation. --Colon: No focal abnormality. --Appendix: Normal. Lymphatic:  No abdominal or pelvic lymphadenopathy. Reproductive: Pelvis obscured by streak artifact Musculoskeletal. Bilateral total hip arthroplasties. Multilevel spinal degenerative disc disease including flowing osteophytes throughout most of the thoracic spine. Other: None Review of the MIP images confirms the above findings. IMPRESSION: 1. No acute aortic syndrome or other acute abnormality of the chest, abdomen or pelvis. 2. 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 3. Flowing for thoracic osteophytosis consistent with diffuse idiopathic skeletal hyperostosis. Electronically Signed   By: Deatra Robinson M.D.   On: 12/26/2017 06:20    EKG EKG Interpretation  Date/Time:  Monday December 26 2017 05:02:12 EDT Ventricular Rate:  115 PR Interval:    QRS Duration: 91 QT Interval:  331 QTC Calculation: 458 R Axis:   -2 Text Interpretation:  Sinus tachycardia RSR' in V1 or V2, right VCD or RVH Confirmed by Nicanor Alcon, Lorie Melichar ([id number]) on 12/26/2017 5:06:14 AM   Radiology Dg Chest Portable 1 View  Result Date: 12/26/2017 CLINICAL DATA:  Left lower abdominal pain, shortness of breath, and central chest pain for 2 days. EXAM: PORTABLE CHEST 1 VIEW COMPARISON:  None. FINDINGS: Shallow inspiration. The heart size and mediastinal contours are within normal limits. Both lungs are clear. The visualized skeletal structures are unremarkable. IMPRESSION: No active disease. Electronically Signed   By: Burman Nieves M.D.   On: 12/26/2017 05:29   Ct Angio Chest/abd/pel For Dissection W And/or Wo Contrast  Result Date: 12/26/2017 CLINICAL DATA:  Shortness of breath and chest pain EXAM: CT ANGIOGRAPHY CHEST, ABDOMEN AND PELVIS TECHNIQUE: Multidetector CT imaging through the chest, abdomen and pelvis was performed using the standard protocol during bolus administration of intravenous contrast. Multiplanar reconstructed images and MIPs were obtained and reviewed to evaluate the vascular anatomy. CONTRAST:  ISOVUE-370 IOPAMIDOL (ISOVUE-370) INJECTION 76% COMPARISON:  None. FINDINGS: CTA CHEST FINDINGS Cardiovascular: --Heart: The heart size is normal.  There is nopericardial effusion. --Aorta: The course and caliber of the thoracic aorta are normal. There is no aortic atherosclerotic calcification. Precontrast images show no aortic intramural hematoma. There is no blood pool, dissection or penetrating ulcer demonstrated on arterial phase postcontrast imaging. There is a conventional 3 vessel aortic arch branching pattern. The proximal arch vessels are widely patent. --Pulmonary Arteries: Contrast timing is optimized for preferential opacification of the aorta. Within that limitation, normal central  pulmonary arteries. Mediastinum/Nodes: No mediastinal, hilar or axillary lymphadenopathy. The visualized thyroid and thoracic esophageal course are unremarkable. Lungs/Pleura: No pulmonary nodules or masses. No pleural effusion or pneumothorax. No focal airspace consolidation. No focal pleural abnormality. Musculoskeletal: No chest wall abnormality. No acute osseous findings. Review of the MIP images confirms the above findings. CTA ABDOMEN AND PELVIS FINDINGS VASCULAR Aorta: Normal caliber aorta without aneurysm, dissection, vasculitis or hemodynamically significant stenosis. There is no aortic atherosclerosis. Celiac: No aneurysm, dissection or hemodynamically significant stenosis. Normal branching pattern. SMA: Widely patent without dissection or stenosis. Renals:  Single renal arteries bilaterally. No aneurysm, dissection, stenosis or evidence of fibromuscular dysplasia. IMA: Patent without abnormality. Inflow: No aneurysm, stenosis or dissection. Veins: Normal course and caliber of the major veins. Assessment is otherwise limited by the arterial dominant contrast phase. Review of the MIP images confirms the above findings. NON-VASCULAR Hepatobiliary: Left hepatic lobe hemangioma measures 4.2 cm. Normal gallbladder. Pancreas: Normal contours without ductal dilatation. No peripancreatic fluid collection. Spleen: Normal arterial phase splenic enhancement pattern. Adrenals/Urinary Tract: --Adrenal glands: Normal. --Right kidney/ureter: No hydronephrosis or perinephric stranding. No nephrolithiasis. No obstructing ureteral stones. --Left kidney/ureter: No hydronephrosis or perinephric stranding. No nephrolithiasis. No obstructing ureteral stones. --Urinary bladder: Unremarkable. Stomach/Bowel: --Stomach/Duodenum: No hiatal hernia or other gastric abnormality. Normal duodenal course and caliber. --Small bowel: No dilatation or inflammation. --Colon: No focal abnormality. --Appendix: Normal. Lymphatic:  No abdominal or  pelvic lymphadenopathy. Reproductive: Pelvis obscured by streak artifact Musculoskeletal. Bilateral total hip arthroplasties. Multilevel spinal degenerative disc disease including flowing osteophytes throughout most of the thoracic spine. Other: None Review of the MIP images confirms the above findings. IMPRESSION: 1. No acute aortic syndrome or other acute abnormality of the chest, abdomen or pelvis. 2. 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 3. Flowing for thoracic osteophytosis consistent with diffuse idiopathic skeletal hyperostosis. Electronically Signed   By: Deatra Robinson M.D.   On: 12/26/2017 06:20    Procedures Procedures (including critical care time)  Medications Ordered in ED Medications  iopamidol (ISOVUE-370) 76 % injection (has no administration in time range)  iopamidol (ISOVUE-370) 76 % injection 100 mL (100 mLs Intravenous Contrast Given 12/26/17 0548)  ketorolac (TORADOL) 30 MG/ML injection 30 mg (30 mg Intravenous Given 12/26/17 0634)  dicyclomine (BENTYL) injection 20 mg (20 mg Intramuscular Given 12/26/17 0635)     [facility]A database reviewed, patient recently filled 120# norco 10/325.  He has been consistently filling these monthly.    I suspect this is cramping, it is likely viral or dietary.  Patient ruled out for MI as symptoms constant x > 24 hours with normal EKG and troponin, no dissection or PE.  Patient has narcotics at home for chronic pain.  Have recommended miralax and high fiber diet as well as close follow up with his PMD.   Final Clinical Impressions(s) / ED Diagnoses    Return for weakness, numbness, changes in vision or speech, fevers >100.4 unrelieved by medication, shortness of breath, intractable vomiting, or diarrhea, abdominal pain, Inability to tolerate liquids or food, cough, altered mental status or any concerns. No signs of systemic illness or infection. The patient is nontoxic-appearing on exam and vital signs are within normal limits.     I have reviewed the triage vital signs and the nursing notes. Pertinent labs &imaging results that were available during my care of the patient were reviewed by me and considered in my medical decision making (see chart for details).  After history, exam, and medical workup I feel the patient has been appropriately medically screened and is safe for discharge home. Pertinent diagnoses were discussed with the patient. Patient was given return precautions.   Britnee Mcdevitt, MD 12/26/17 [id number]    Cy Blamer, MD 12/26/17 1191

## 2017-12-26 NOTE — ED Triage Notes (Addendum)
Pt came in d/t having L. lower abd pain w/ SOB, & cent chest pain that's being going on for x2days.

## 2017-12-28 ENCOUNTER — Inpatient Hospital Stay (HOSPITAL_COMMUNITY)
Admission: EM | Admit: 2017-12-28 | Discharge: 2017-12-30 | DRG: 419 | Disposition: A | Discharge status: Home / Self Care | Payer: Medicare HMO | Attending: General Surgery | Admitting: General Surgery

## 2017-12-28 ENCOUNTER — Encounter (HOSPITAL_COMMUNITY): Payer: Self-pay | Admitting: *Deleted

## 2017-12-28 ENCOUNTER — Other Ambulatory Visit: Payer: Self-pay

## 2017-12-28 ENCOUNTER — Inpatient Hospital Stay (HOSPITAL_COMMUNITY): Payer: Medicare HMO | Admitting: Anesthesiology

## 2017-12-28 ENCOUNTER — Emergency Department (HOSPITAL_COMMUNITY): Payer: Medicare HMO

## 2017-12-28 ENCOUNTER — Inpatient Hospital Stay (HOSPITAL_COMMUNITY): Payer: Medicare HMO

## 2017-12-28 ENCOUNTER — Encounter (HOSPITAL_COMMUNITY): Admission: EM | Disposition: A | Discharge status: Home / Self Care | Payer: Self-pay | Source: Home / Self Care

## 2017-12-28 DIAGNOSIS — R5081 Fever presenting with conditions classified elsewhere: Secondary | ICD-10-CM

## 2017-12-28 DIAGNOSIS — Z7984 Long term (current) use of oral hypoglycemic drugs: Secondary | ICD-10-CM

## 2017-12-28 DIAGNOSIS — G4733 Obstructive sleep apnea (adult) (pediatric): Secondary | ICD-10-CM | POA: Diagnosis present

## 2017-12-28 DIAGNOSIS — Z7951 Long term (current) use of inhaled steroids: Secondary | ICD-10-CM | POA: Diagnosis not present

## 2017-12-28 DIAGNOSIS — E1165 Type 2 diabetes mellitus with hyperglycemia: Secondary | ICD-10-CM | POA: Diagnosis present

## 2017-12-28 DIAGNOSIS — K5900 Constipation, unspecified: Secondary | ICD-10-CM | POA: Diagnosis present

## 2017-12-28 DIAGNOSIS — I10 Essential (primary) hypertension: Secondary | ICD-10-CM | POA: Diagnosis present

## 2017-12-28 DIAGNOSIS — Z6838 Body mass index (BMI) 38.0-38.9, adult: Secondary | ICD-10-CM

## 2017-12-28 DIAGNOSIS — K8000 Calculus of gallbladder with acute cholecystitis without obstruction: Secondary | ICD-10-CM | POA: Diagnosis present

## 2017-12-28 DIAGNOSIS — K760 Fatty (change of) liver, not elsewhere classified: Secondary | ICD-10-CM | POA: Diagnosis present

## 2017-12-28 DIAGNOSIS — Z79891 Long term (current) use of opiate analgesic: Secondary | ICD-10-CM | POA: Diagnosis not present

## 2017-12-28 DIAGNOSIS — Z87891 Personal history of nicotine dependence: Secondary | ICD-10-CM | POA: Diagnosis not present

## 2017-12-28 DIAGNOSIS — K810 Acute cholecystitis: Secondary | ICD-10-CM | POA: Diagnosis present

## 2017-12-28 DIAGNOSIS — Z79899 Other long term (current) drug therapy: Secondary | ICD-10-CM

## 2017-12-28 DIAGNOSIS — G8929 Other chronic pain: Secondary | ICD-10-CM | POA: Diagnosis present

## 2017-12-28 DIAGNOSIS — R1011 Right upper quadrant pain: Secondary | ICD-10-CM | POA: Diagnosis present

## 2017-12-28 DIAGNOSIS — Z419 Encounter for procedure for purposes other than remedying health state, unspecified: Secondary | ICD-10-CM

## 2017-12-28 HISTORY — DX: Fatty (change of) liver, not elsewhere classified: K76.0

## 2017-12-28 HISTORY — DX: Pain in unspecified hip: M25.559

## 2017-12-28 HISTORY — DX: Other chronic pain: G89.29

## 2017-12-28 HISTORY — DX: Obstructive sleep apnea (adult) (pediatric): G47.33

## 2017-12-28 HISTORY — PX: CHOLECYSTECTOMY: SHX55

## 2017-12-28 HISTORY — DX: Type 2 diabetes mellitus without complications: E11.9

## 2017-12-28 LAB — COMPREHENSIVE METABOLIC PANEL
ALBUMIN: 3.7 g/dL (ref 3.5–5.0)
ALT: 49 U/L — ABNORMAL HIGH (ref 0–44)
AST: 35 U/L (ref 15–41)
Alkaline Phosphatase: 73 U/L (ref 38–126)
Anion gap: 12 (ref 5–15)
BUN: 15 mg/dL (ref 6–20)
CHLORIDE: 96 mmol/L — AB (ref 98–111)
CO2: 24 mmol/L (ref 22–32)
Calcium: 8.8 mg/dL — ABNORMAL LOW (ref 8.9–10.3)
Creatinine, Ser: 1.03 mg/dL (ref 0.61–1.24)
GFR calc Af Amer: >60 mL/min (ref >60)
GFR calc non Af Amer: >60 mL/min (ref >60)
GLUCOSE: 256 mg/dL — AB (ref 70–99)
POTASSIUM: 3.8 mmol/L (ref 3.5–5.1)
SODIUM: 132 mmol/L — AB (ref 135–145)
Total Bilirubin: 1.5 mg/dL — ABNORMAL HIGH (ref 0.3–1.2)
Total Protein: 8.4 g/dL — ABNORMAL HIGH (ref 6.5–8.1)

## 2017-12-28 LAB — URINALYSIS, ROUTINE W REFLEX MICROSCOPIC
BACTERIA UA: NONE SEEN
Bilirubin Urine: NEGATIVE
GLUCOSE, UA: 150 mg/dL — AB
KETONES UR: 20 mg/dL — AB
Leukocytes, UA: NEGATIVE
Nitrite: NEGATIVE
PH: 6 (ref 5.0–8.0)
Protein, ur: >=300 mg/dL — AB
Specific Gravity, Urine: 1.038 — ABNORMAL HIGH (ref 1.005–1.030)

## 2017-12-28 LAB — CBC WITH DIFFERENTIAL/PLATELET
ABS IMMATURE GRANULOCYTES: 0.2 10*3/uL — AB (ref 0.00–0.07)
Basophils Absolute: 0.1 10*3/uL (ref 0.0–0.1)
Basophils Relative: 0 %
EOS ABS: 0.1 10*3/uL (ref 0.0–0.5)
Eosinophils Relative: 0 %
HEMATOCRIT: 47.9 % (ref 39.0–52.0)
Hemoglobin: 15.4 g/dL (ref 13.0–17.0)
IMMATURE GRANULOCYTES: 1 %
LYMPHS ABS: 1.2 10*3/uL (ref 0.7–4.0)
Lymphocytes Relative: 6 %
MCH: 30.1 pg (ref 26.0–34.0)
MCHC: 32.2 g/dL (ref 30.0–36.0)
MCV: 93.6 fL (ref 80.0–100.0)
MONO ABS: 2.6 10*3/uL — AB (ref 0.1–1.0)
Monocytes Relative: 12 %
NEUTROS ABS: 17.1 10*3/uL — AB (ref 1.7–7.7)
NEUTROS PCT: 81 %
Platelets: 212 10*3/uL (ref 150–400)
RBC: 5.12 MIL/uL (ref 4.22–5.81)
RDW: 15 % (ref 11.5–15.5)
WBC: 21.3 10*3/uL — ABNORMAL HIGH (ref 4.0–10.5)
nRBC: 0 % (ref 0.0–0.2)

## 2017-12-28 LAB — GLUCOSE, CAPILLARY
GLUCOSE-CAPILLARY: 255 mg/dL — AB (ref 70–99)
Glucose-Capillary: 214 mg/dL — ABNORMAL HIGH (ref 70–99)
Glucose-Capillary: 235 mg/dL — ABNORMAL HIGH (ref 70–99)

## 2017-12-28 LAB — LACTIC ACID, PLASMA: Lactic Acid, Venous: 1.5 mmol/L (ref 0.5–1.9)

## 2017-12-28 LAB — LIPASE, BLOOD: Lipase: 22 U/L (ref 11–51)

## 2017-12-28 SURGERY — LAPAROSCOPIC CHOLECYSTECTOMY WITH INTRAOPERATIVE CHOLANGIOGRAM
Anesthesia: General

## 2017-12-28 MED ORDER — POLYETHYLENE GLYCOL 3350 17 G PO PACK: 17.0000 g | PACK | Freq: Every day | ORAL | Status: DC | PRN

## 2017-12-28 MED ORDER — OXYCODONE HCL 5 MG/5ML PO SOLN
5.0000 mg | Freq: Once | ORAL | Status: AC | PRN
  Filled 2017-12-28: qty 5

## 2017-12-28 MED ORDER — PHENYLEPHRINE 40 MCG/ML (10ML) SYRINGE FOR IV PUSH (FOR BLOOD PRESSURE SUPPORT)
PREFILLED_SYRINGE | INTRAVENOUS | Status: AC
  Filled 2017-12-28: qty 10

## 2017-12-28 MED ORDER — SUGAMMADEX SODIUM 500 MG/5ML IV SOLN
INTRAVENOUS | Status: DC | PRN
  Administered 2017-12-28: 400 mg via INTRAVENOUS

## 2017-12-28 MED ORDER — LIDOCAINE 2% (20 MG/ML) 5 ML SYRINGE
INTRAMUSCULAR | Status: AC
  Filled 2017-12-28: qty 5

## 2017-12-28 MED ORDER — OXYCODONE HCL 5 MG PO TABS
5.0000 mg | ORAL_TABLET | ORAL | Status: DC | PRN
  Administered 2017-12-28 – 2017-12-29 (×3): 10 mg via ORAL
  Administered 2017-12-29: 5 mg via ORAL
  Administered 2017-12-29: 10 mg via ORAL
  Administered 2017-12-29: 5 mg via ORAL
  Administered 2017-12-30 (×2): 10 mg via ORAL
  Filled 2017-12-28 (×8): qty 2

## 2017-12-28 MED ORDER — PROPOFOL 10 MG/ML IV BOLUS
INTRAVENOUS | Status: AC
  Filled 2017-12-28: qty 20

## 2017-12-28 MED ORDER — SUGAMMADEX SODIUM 500 MG/5ML IV SOLN
INTRAVENOUS | Status: AC
  Filled 2017-12-28: qty 5

## 2017-12-28 MED ORDER — DIPHENHYDRAMINE HCL 25 MG PO CAPS: 25.0000 mg | ORAL_CAPSULE | Freq: Four times a day (QID) | ORAL | Status: DC | PRN

## 2017-12-28 MED ORDER — ONDANSETRON HCL 4 MG/2ML IJ SOLN
INTRAMUSCULAR | Status: AC
  Filled 2017-12-28: qty 2

## 2017-12-28 MED ORDER — ZOLPIDEM TARTRATE 5 MG PO TABS
5.0000 mg | ORAL_TABLET | Freq: Every evening | ORAL | Status: DC | PRN
  Administered 2017-12-29: 5 mg via ORAL
  Filled 2017-12-28: qty 1

## 2017-12-28 MED ORDER — MIDAZOLAM HCL 2 MG/2ML IJ SOLN
INTRAMUSCULAR | Status: AC
  Filled 2017-12-28: qty 2

## 2017-12-28 MED ORDER — FENTANYL CITRATE (PF) 100 MCG/2ML IJ SOLN
INTRAMUSCULAR | Status: AC
  Filled 2017-12-28: qty 2

## 2017-12-28 MED ORDER — BUPIVACAINE-EPINEPHRINE (PF) 0.5% -1:200000 IJ SOLN
INTRAMUSCULAR | Status: DC | PRN
  Administered 2017-12-28: 30 mL via PERINEURAL

## 2017-12-28 MED ORDER — SODIUM CHLORIDE 0.9 % IV BOLUS
1000.0000 mL | Freq: Once | INTRAVENOUS | Status: AC
  Administered 2017-12-28: 1000 mL via INTRAVENOUS

## 2017-12-28 MED ORDER — OXYCODONE HCL 5 MG PO TABS
5.0000 mg | ORAL_TABLET | Freq: Once | ORAL | Status: AC | PRN
  Administered 2017-12-28: 5 mg via ORAL

## 2017-12-28 MED ORDER — LOSARTAN POTASSIUM 50 MG PO TABS
50.0000 mg | ORAL_TABLET | Freq: Every day | ORAL | Status: DC
  Administered 2017-12-28 – 2017-12-30 (×3): 50 mg via ORAL
  Filled 2017-12-28 (×3): qty 1

## 2017-12-28 MED ORDER — MORPHINE SULFATE (PF) 2 MG/ML IV SOLN
1.0000 mg | INTRAVENOUS | Status: DC | PRN
  Administered 2017-12-28 – 2017-12-29 (×3): 2 mg via INTRAVENOUS
  Administered 2017-12-30 (×4): 4 mg via INTRAVENOUS
  Filled 2017-12-28: qty 2
  Filled 2017-12-28: qty 1
  Filled 2017-12-28 (×3): qty 2
  Filled 2017-12-28 (×2): qty 1
  Filled 2017-12-28: qty 2

## 2017-12-28 MED ORDER — SIMETHICONE 80 MG PO CHEW
40.0000 mg | CHEWABLE_TABLET | Freq: Four times a day (QID) | ORAL | Status: DC | PRN
  Administered 2017-12-29: 40 mg via ORAL
  Filled 2017-12-28: qty 1

## 2017-12-28 MED ORDER — ALBUTEROL SULFATE (2.5 MG/3ML) 0.083% IN NEBU: 2.5000 mg | INHALATION_SOLUTION | Freq: Four times a day (QID) | RESPIRATORY_TRACT | Status: DC | PRN

## 2017-12-28 MED ORDER — SUCCINYLCHOLINE CHLORIDE 200 MG/10ML IV SOSY
PREFILLED_SYRINGE | INTRAVENOUS | Status: DC | PRN
  Administered 2017-12-28: 140 mg via INTRAVENOUS

## 2017-12-28 MED ORDER — LABETALOL HCL 5 MG/ML IV SOLN
INTRAVENOUS | Status: AC
  Filled 2017-12-28: qty 4

## 2017-12-28 MED ORDER — ONDANSETRON HCL 4 MG/2ML IJ SOLN
INTRAMUSCULAR | Status: DC | PRN
  Administered 2017-12-28: 4 mg via INTRAVENOUS

## 2017-12-28 MED ORDER — PROPOFOL 10 MG/ML IV BOLUS
INTRAVENOUS | Status: DC | PRN
  Administered 2017-12-28: 20 mg via INTRAVENOUS

## 2017-12-28 MED ORDER — FENTANYL CITRATE (PF) 100 MCG/2ML IJ SOLN
25.0000 ug | INTRAMUSCULAR | Status: DC | PRN
  Administered 2017-12-28 (×3): 50 ug via INTRAVENOUS

## 2017-12-28 MED ORDER — FENTANYL CITRATE (PF) 100 MCG/2ML IJ SOLN
50.0000 ug | Freq: Once | INTRAMUSCULAR | Status: AC
  Administered 2017-12-28: 50 ug via INTRAVENOUS
  Filled 2017-12-28: qty 2

## 2017-12-28 MED ORDER — INSULIN ASPART 100 UNIT/ML ~~LOC~~ SOLN
0.0000 [IU] | Freq: Three times a day (TID) | SUBCUTANEOUS | Status: DC
  Administered 2017-12-28: 11 [IU] via SUBCUTANEOUS
  Administered 2017-12-29: 4 [IU] via SUBCUTANEOUS
  Administered 2017-12-29 (×2): 11 [IU] via SUBCUTANEOUS
  Administered 2017-12-30: 7 [IU] via SUBCUTANEOUS
  Administered 2017-12-30: 4 [IU] via SUBCUTANEOUS

## 2017-12-28 MED ORDER — PANTOPRAZOLE SODIUM 40 MG PO TBEC
40.0000 mg | DELAYED_RELEASE_TABLET | Freq: Every day | ORAL | Status: DC
  Administered 2017-12-29 – 2017-12-30 (×2): 40 mg via ORAL
  Filled 2017-12-28 (×2): qty 1

## 2017-12-28 MED ORDER — OXYCODONE HCL 5 MG PO TABS
ORAL_TABLET | ORAL | Status: AC
  Administered 2017-12-28: 5 mg via ORAL
  Filled 2017-12-28: qty 1

## 2017-12-28 MED ORDER — 0.9 % SODIUM CHLORIDE (POUR BTL) OPTIME
TOPICAL | Status: DC | PRN
  Administered 2017-12-28: 1000 mL

## 2017-12-28 MED ORDER — SODIUM CHLORIDE 0.9 % IV BOLUS (SEPSIS)
1000.0000 mL | Freq: Once | INTRAVENOUS | Status: AC
  Administered 2017-12-28: 1000 mL via INTRAVENOUS

## 2017-12-28 MED ORDER — IOPAMIDOL (ISOVUE-300) INJECTION 61%
INTRAVENOUS | Status: AC
  Filled 2017-12-28: qty 50

## 2017-12-28 MED ORDER — ONDANSETRON 4 MG PO TBDP: 4.0000 mg | ORAL_TABLET | Freq: Four times a day (QID) | ORAL | Status: DC | PRN

## 2017-12-28 MED ORDER — INSULIN ASPART 100 UNIT/ML ~~LOC~~ SOLN
SUBCUTANEOUS | Status: AC
  Administered 2017-12-28: 11 [IU] via SUBCUTANEOUS
  Filled 2017-12-28: qty 1

## 2017-12-28 MED ORDER — HYDRALAZINE HCL 20 MG/ML IJ SOLN: 10.0000 mg | INTRAMUSCULAR | Status: DC | PRN

## 2017-12-28 MED ORDER — ESMOLOL HCL 100 MG/10ML IV SOLN
INTRAVENOUS | Status: AC
  Filled 2017-12-28: qty 10

## 2017-12-28 MED ORDER — METHOCARBAMOL 1000 MG/10ML IJ SOLN
500.0000 mg | Freq: Three times a day (TID) | INTRAVENOUS | Status: DC | PRN
  Administered 2017-12-29: 500 mg via INTRAVENOUS
  Filled 2017-12-28: qty 500
  Filled 2017-12-28: qty 5

## 2017-12-28 MED ORDER — ACETAMINOPHEN 325 MG PO TABS
650.0000 mg | ORAL_TABLET | Freq: Once | ORAL | Status: AC | PRN
  Administered 2017-12-28: 650 mg via ORAL
  Filled 2017-12-28: qty 2

## 2017-12-28 MED ORDER — LACTATED RINGERS IR SOLN
Status: DC | PRN
  Administered 2017-12-28: 2

## 2017-12-28 MED ORDER — ROCURONIUM BROMIDE 10 MG/ML (PF) SYRINGE
PREFILLED_SYRINGE | INTRAVENOUS | Status: AC
  Filled 2017-12-28: qty 10

## 2017-12-28 MED ORDER — LABETALOL HCL 5 MG/ML IV SOLN
INTRAVENOUS | Status: DC | PRN
  Administered 2017-12-28 (×2): 5 mg via INTRAVENOUS

## 2017-12-28 MED ORDER — ROCURONIUM BROMIDE 10 MG/ML (PF) SYRINGE
PREFILLED_SYRINGE | INTRAVENOUS | Status: DC | PRN
  Administered 2017-12-28: 10 mg via INTRAVENOUS
  Administered 2017-12-28: 50 mg via INTRAVENOUS

## 2017-12-28 MED ORDER — FENTANYL CITRATE (PF) 100 MCG/2ML IJ SOLN
INTRAMUSCULAR | Status: AC
  Administered 2017-12-28: 50 ug via INTRAVENOUS
  Filled 2017-12-28: qty 2

## 2017-12-28 MED ORDER — ENOXAPARIN SODIUM 40 MG/0.4ML ~~LOC~~ SOLN: 40.0000 mg | SUBCUTANEOUS | Status: DC

## 2017-12-28 MED ORDER — MIDAZOLAM HCL 5 MG/5ML IJ SOLN
INTRAMUSCULAR | Status: DC | PRN
  Administered 2017-12-28: 2 mg via INTRAVENOUS

## 2017-12-28 MED ORDER — SUCCINYLCHOLINE CHLORIDE 200 MG/10ML IV SOSY
PREFILLED_SYRINGE | INTRAVENOUS | Status: AC
  Filled 2017-12-28: qty 10

## 2017-12-28 MED ORDER — TOPIRAMATE 25 MG PO TABS
50.0000 mg | ORAL_TABLET | Freq: Two times a day (BID) | ORAL | Status: DC
  Administered 2017-12-28 – 2017-12-30 (×5): 50 mg via ORAL
  Filled 2017-12-28 (×6): qty 2

## 2017-12-28 MED ORDER — PHENYLEPHRINE 40 MCG/ML (10ML) SYRINGE FOR IV PUSH (FOR BLOOD PRESSURE SUPPORT)
PREFILLED_SYRINGE | INTRAVENOUS | Status: DC | PRN
  Administered 2017-12-28: 80 ug via INTRAVENOUS

## 2017-12-28 MED ORDER — IBUPROFEN 800 MG PO TABS
800.0000 mg | ORAL_TABLET | Freq: Once | ORAL | Status: AC
  Administered 2017-12-28: 800 mg via ORAL
  Filled 2017-12-28: qty 1

## 2017-12-28 MED ORDER — FENTANYL CITRATE (PF) 100 MCG/2ML IJ SOLN
INTRAMUSCULAR | Status: DC | PRN
  Administered 2017-12-28 (×4): 50 ug via INTRAVENOUS

## 2017-12-28 MED ORDER — DIPHENHYDRAMINE HCL 50 MG/ML IJ SOLN: 25.0000 mg | Freq: Four times a day (QID) | INTRAMUSCULAR | Status: DC | PRN

## 2017-12-28 MED ORDER — LIDOCAINE 2% (20 MG/ML) 5 ML SYRINGE
INTRAMUSCULAR | Status: DC | PRN
  Administered 2017-12-28: 100 mg via INTRAVENOUS

## 2017-12-28 MED ORDER — VANCOMYCIN HCL 10 G IV SOLR
2500.0000 mg | Freq: Once | INTRAVENOUS | Status: AC
  Administered 2017-12-28: 2500 mg via INTRAVENOUS
  Filled 2017-12-28: qty 2000

## 2017-12-28 MED ORDER — SODIUM CHLORIDE 0.9 % IV SOLN
2.0000 g | Freq: Once | INTRAVENOUS | Status: AC
  Administered 2017-12-28: 2 g via INTRAVENOUS
  Filled 2017-12-28: qty 2

## 2017-12-28 MED ORDER — POTASSIUM CHLORIDE IN NACL 20-0.9 MEQ/L-% IV SOLN
INTRAVENOUS | Status: DC
  Administered 2017-12-28 – 2017-12-29 (×3): via INTRAVENOUS
  Filled 2017-12-28 (×2): qty 1000

## 2017-12-28 MED ORDER — DEXAMETHASONE SODIUM PHOSPHATE 10 MG/ML IJ SOLN
INTRAMUSCULAR | Status: DC | PRN
  Administered 2017-12-28: 10 mg via INTRAVENOUS

## 2017-12-28 MED ORDER — LOSARTAN POTASSIUM-HCTZ 50-12.5 MG PO TABS: 1.0000 | ORAL_TABLET | Freq: Every day | ORAL | Status: DC

## 2017-12-28 MED ORDER — INSULIN ASPART 100 UNIT/ML ~~LOC~~ SOLN: 0.0000 [IU] | Freq: Three times a day (TID) | SUBCUTANEOUS | Status: DC

## 2017-12-28 MED ORDER — MORPHINE SULFATE (PF) 2 MG/ML IV SOLN
1.0000 mg | INTRAVENOUS | Status: DC | PRN
  Administered 2017-12-28: 2 mg via INTRAVENOUS
  Filled 2017-12-28: qty 1

## 2017-12-28 MED ORDER — VANCOMYCIN HCL IN DEXTROSE 1-5 GM/200ML-% IV SOLN: 1000.0000 mg | Freq: Once | INTRAVENOUS | Status: DC

## 2017-12-28 MED ORDER — ESMOLOL HCL 100 MG/10ML IV SOLN
INTRAVENOUS | Status: DC | PRN
  Administered 2017-12-28 (×2): 20 mg via INTRAVENOUS

## 2017-12-28 MED ORDER — ONDANSETRON HCL 4 MG/2ML IJ SOLN: 4.0000 mg | Freq: Four times a day (QID) | INTRAMUSCULAR | Status: DC | PRN

## 2017-12-28 MED ORDER — PIPERACILLIN-TAZOBACTAM 3.375 G IVPB
3.3750 g | Freq: Three times a day (TID) | INTRAVENOUS | Status: DC
  Administered 2017-12-28 – 2017-12-30 (×6): 3.375 g via INTRAVENOUS
  Filled 2017-12-28 (×6): qty 50

## 2017-12-28 MED ORDER — METOPROLOL TARTRATE 5 MG/5ML IV SOLN: 5.0000 mg | Freq: Four times a day (QID) | INTRAVENOUS | Status: DC | PRN

## 2017-12-28 MED ORDER — ENOXAPARIN SODIUM 40 MG/0.4ML ~~LOC~~ SOLN
40.0000 mg | SUBCUTANEOUS | Status: DC
  Administered 2017-12-29 – 2017-12-30 (×2): 40 mg via SUBCUTANEOUS
  Filled 2017-12-28 (×2): qty 0.4

## 2017-12-28 MED ORDER — PROMETHAZINE HCL 25 MG/ML IJ SOLN: 6.2500 mg | INTRAMUSCULAR | Status: DC | PRN

## 2017-12-28 MED ORDER — HYDROCHLOROTHIAZIDE 12.5 MG PO CAPS
12.5000 mg | ORAL_CAPSULE | Freq: Every day | ORAL | Status: DC
  Administered 2017-12-28 – 2017-12-30 (×3): 12.5 mg via ORAL
  Filled 2017-12-28 (×3): qty 1

## 2017-12-28 MED ORDER — METRONIDAZOLE IN NACL 5-0.79 MG/ML-% IV SOLN
500.0000 mg | Freq: Three times a day (TID) | INTRAVENOUS | Status: DC
  Administered 2017-12-28: 500 mg via INTRAVENOUS
  Filled 2017-12-28: qty 100

## 2017-12-28 MED ORDER — LACTATED RINGERS IV SOLN
INTRAVENOUS | Status: DC
  Administered 2017-12-28 (×2): via INTRAVENOUS

## 2017-12-28 MED ORDER — BUPIVACAINE HCL (PF) 0.25 % IJ SOLN
INTRAMUSCULAR | Status: AC
  Filled 2017-12-28: qty 30

## 2017-12-28 MED ORDER — ACETAMINOPHEN 500 MG PO TABS
1000.0000 mg | ORAL_TABLET | Freq: Four times a day (QID) | ORAL | Status: DC
  Administered 2017-12-28 – 2017-12-30 (×5): 1000 mg via ORAL
  Filled 2017-12-28 (×5): qty 2

## 2017-12-28 MED ORDER — DEXAMETHASONE SODIUM PHOSPHATE 10 MG/ML IJ SOLN
INTRAMUSCULAR | Status: AC
  Filled 2017-12-28: qty 1

## 2017-12-28 MED ORDER — POTASSIUM CHLORIDE IN NACL 20-0.9 MEQ/L-% IV SOLN
INTRAVENOUS | Status: DC
  Administered 2017-12-28: 10:00:00 via INTRAVENOUS
  Filled 2017-12-28: qty 1000

## 2017-12-28 SURGICAL SUPPLY — 47 items
APPLIER CLIP ROT 10 11.4 M/L (STAPLE)
BANDAGE ADH SHEER 1  50/CT (GAUZE/BANDAGES/DRESSINGS) ×12 IMPLANT
BENZOIN TINCTURE PRP APPL 2/3 (GAUZE/BANDAGES/DRESSINGS) ×3 IMPLANT
CABLE HIGH FREQUENCY MONO STRZ (ELECTRODE) ×3 IMPLANT
CATH CHOLANG 76X19 KUMAR (CATHETERS) ×3 IMPLANT
CHLORAPREP W/TINT 26ML (MISCELLANEOUS) ×3 IMPLANT
CLIP APPLIE ROT 10 11.4 M/L (STAPLE) IMPLANT
CLIP VESOLOCK LG 6/CT PURPLE (CLIP) IMPLANT
CLIP VESOLOCK MED LG 6/CT (CLIP) IMPLANT
CLOSURE WOUND 1/2 X4 (GAUZE/BANDAGES/DRESSINGS) ×1
COVER MAYO STAND STRL (DRAPES) ×3 IMPLANT
COVER SURGICAL LIGHT HANDLE (MISCELLANEOUS) ×3 IMPLANT
COVER WAND RF STERILE (DRAPES) IMPLANT
DECANTER SPIKE VIAL GLASS SM (MISCELLANEOUS) ×3 IMPLANT
DERMABOND ADVANCED (GAUZE/BANDAGES/DRESSINGS) ×2
DERMABOND ADVANCED .7 DNX12 (GAUZE/BANDAGES/DRESSINGS) ×1 IMPLANT
DRAIN CHANNEL 19F RND (DRAIN) ×3 IMPLANT
DRAPE C-ARM 42X120 X-RAY (DRAPES) IMPLANT
DRSG TEGADERM 4X4.75 (GAUZE/BANDAGES/DRESSINGS) ×3 IMPLANT
EVACUATOR SILICONE 100CC (DRAIN) ×3 IMPLANT
GAUZE SPONGE 2X2 8PLY STRL LF (GAUZE/BANDAGES/DRESSINGS) ×1 IMPLANT
GLOVE BIOGEL PI IND STRL 7.0 (GLOVE) ×1 IMPLANT
GLOVE BIOGEL PI INDICATOR 7.0 (GLOVE) ×2
GLOVE SURG SS PI 7.0 STRL IVOR (GLOVE) ×3 IMPLANT
GOWN STRL REUS W/TWL LRG LVL3 (GOWN DISPOSABLE) ×3 IMPLANT
GOWN STRL REUS W/TWL XL LVL3 (GOWN DISPOSABLE) ×6 IMPLANT
GRASPER SUT TROCAR 14GX15 (MISCELLANEOUS) IMPLANT
KIT BASIN OR (CUSTOM PROCEDURE TRAY) ×3 IMPLANT
POUCH RETRIEVAL ECOSAC 10 (ENDOMECHANICALS) ×1 IMPLANT
POUCH RETRIEVAL ECOSAC 10MM (ENDOMECHANICALS) ×2
SCISSORS LAP 5X35 DISP (ENDOMECHANICALS) ×3 IMPLANT
SET IRRIG TUBING LAPAROSCOPIC (IRRIGATION / IRRIGATOR) ×3 IMPLANT
SLEEVE XCEL OPT CAN 5 100 (ENDOMECHANICALS) ×9 IMPLANT
SPONGE GAUZE 2X2 STER 10/PKG (GAUZE/BANDAGES/DRESSINGS) ×2
STOPCOCK 4 WAY LG BORE MALE ST (IV SETS) IMPLANT
STRIP CLOSURE SKIN 1/2X4 (GAUZE/BANDAGES/DRESSINGS) ×2 IMPLANT
SUT ETHILON 2 0 PS N (SUTURE) IMPLANT
SUT ETHILON 2 0 PSLX (SUTURE) ×3 IMPLANT
SUT MNCRL AB 4-0 PS2 18 (SUTURE) ×3 IMPLANT
SUT VICRYL 0 ENDOLOOP (SUTURE) ×6 IMPLANT
SUT VICRYL 0 TIES 12 18 (SUTURE) ×3 IMPLANT
TOWEL OR 17X26 10 PK STRL BLUE (TOWEL DISPOSABLE) ×3 IMPLANT
TOWEL OR NON WOVEN STRL DISP B (DISPOSABLE) IMPLANT
TRAY LAPAROSCOPIC (CUSTOM PROCEDURE TRAY) ×3 IMPLANT
TROCAR BLADELESS OPT 5 100 (ENDOMECHANICALS) ×3 IMPLANT
TROCAR XCEL NON-BLD 11X100MML (ENDOMECHANICALS) ×3 IMPLANT
TUBING INSUF HEATED (TUBING) ×3 IMPLANT

## 2017-12-28 NOTE — Op Note (Signed)
PATIENT:  Raymond Trevino  57 y.o. male  PRE-OPERATIVE DIAGNOSIS:  cholelithiasis  POST-OPERATIVE DIAGNOSIS:  cholelithiasis  PROCEDURE:  Procedure(s): LAPAROSCOPIC SUBTOTAL CHOLECYSTECTOMY   SURGEON:  Surgeon(s): Karma Hiney, De Blanch, MD  ASSISTANT: Bailey Mech, PAC  ANESTHESIA:   local and general  Indications for procedure: Raymond Trevino is a 57 y.o. male with symptoms of Abdominal pain and Nausea and vomiting consistent with gallbladder disease, Confirmed by Ultrasound.  Description of procedure: The patient was brought into the operative suite, placed supine. Anesthesia was administered with endotracheal tube. Patient was strapped in place and foot board was secured. All pressure points were offloaded by foam padding. The patient was prepped and draped in the usual sterile fashion.  A small incision was made to the right of the umbilicus. A 5mm trocar was inserted into the peritoneal cavity with optical entry. Pneumoperitoneum was applied with high flow low pressure. 2 5mm trocars were placed in the RUQ. A 12mm trocar was placed in the subxiphoid space. Marcaine was infused to the subxiphoid space and lateral upper right abdomen in the transversus abdominis plane. Next the patient was placed in reverse trendelenberg. The gallbladder was distended and adherent to the omentum in the area. It was red in color concerning for acute infection. The liver was quite large and difficult to retract.  The gallbladder was retracted cephalad and lateral. The peritoneum was reflected off the infundibulum working lateral to medial. There was difficulty in viewing the cystic triangle. First, a 5th trocar was placed in the right mid abdomen to allow retraction of the omentum downward. Next, a dome down approach was taken to better visualize the cystic triangle. This allowed separation of the gallbladder body from the liver but still not able to visualize the cystic duct. THerefore, decision was made to  perform subtotal cholecystectomy. Scissors were used to divide the gallbladder near the infundibulum. Multiple medium size stones came out of the gallbladder. There was one vessel that was clipped for hemostasis.  The gallbladder was removed off the liver bed with cautery. The Gallbladder was placed in a specimen bag. The gallbladder fossa was irrigated and hemostasis was applied with cautery. The gallbladder was removed via the 12mm trocar. The fascial defect was closed with interrupted 0 vicryl suture via laparoscopic trans-fascial suture passer. It appeared the gallbladder division was within 2cm of the end of the gallbladder, no attempt was made to close the area. A 19Fr blake drain was placed to drain the area. Pneumoperitoneum was removed, all trocar were removed. All incisions were closed with 4-0 monocryl subcuticular stitch. The patient woke from anesthesia and was brought to PACU in stable condition. All counts were correct  Findings: inflamed gallbladder  Specimen: gallbladder  Blood loss: 100 ml  Local anesthesia: 30 ml marcaine  Complications: unable to visualize cystic triangle/critical view so subtotal cholecystectomy performed  PLAN OF CARE: Admit to inpatient   PATIENT DISPOSITION:  PACU - hemodynamically stable.  Images:    Feliciana Rossetti, M.D. General, Bariatric, & Minimally Invasive Surgery [facility]

## 2017-12-28 NOTE — Progress Notes (Signed)
Pt declined use of cpap tonight.  Pt was advised that RT is available all night, and encouraged him to call should he change his mind.  RN aware.

## 2017-12-28 NOTE — Anesthesia Postprocedure Evaluation (Signed)
Anesthesia Post Note  Patient: Raymond Trevino  Procedure(s) Performed: LAPAROSCOPIC SUBTOTAL CHOLECYSTECTOMY (N/A )     Patient location during evaluation: PACU Anesthesia Type: General Level of consciousness: awake and alert Pain management: pain level controlled Vital Signs Assessment: post-procedure vital signs reviewed and stable Respiratory status: spontaneous breathing, nonlabored ventilation, respiratory function stable and patient connected to nasal cannula oxygen Cardiovascular status: blood pressure returned to baseline, stable and tachycardic Postop Assessment: no apparent nausea or vomiting Anesthetic complications: no    Last Vitals:  Vitals:   12/28/17 1745 12/28/17 1800  BP: 131/82 (!) 143/85  Pulse: (!) 111 (!) 107  Resp: 19 (!) 21  Temp:  37 C  SpO2: 93% 97%    Last Pain:  Vitals:   12/28/17 1800  TempSrc:   PainSc: 6                  Beryle Lathe

## 2017-12-28 NOTE — Anesthesia Procedure Notes (Signed)
Procedure Name: Intubation Date/Time: 12/28/2017 3:15 PM Performed by: Lind Covert, CRNA Pre-anesthesia Checklist: Patient identified, Emergency Drugs available, Suction available, Patient being monitored and Timeout performed Patient Re-evaluated:Patient Re-evaluated prior to induction Oxygen Delivery Method: Circle system utilized Preoxygenation: Pre-oxygenation with 100% oxygen Induction Type: IV induction Laryngoscope Size: Mac and 4 Grade View: Grade I Tube size: 7.5 mm Number of attempts: 1 Airway Equipment and Method: Stylet Placement Confirmation: ETT inserted through vocal cords under direct vision,  positive ETCO2 and breath sounds checked- equal and bilateral Secured at: 23 cm Tube secured with: Tape Dental Injury: Teeth and Oropharynx as per pre-operative assessment

## 2017-12-28 NOTE — ED Provider Notes (Signed)
[facility]-EMERGENCY DEPT Provider Note   CSN: [id number] Arrival date & time: 12/28/17  0011     History   Chief Complaint Chief Complaint  Patient presents with  . Fever  . Abdominal Pain    HPI Raymond Trevino is a 57 y.o. male.  Patient with history of OSA, HTN, migraine presents with abdominal pain, headache and fever. He states symptoms started several days ago with abdominal pain, nausea and vomiting. He came to the ED and states his nausea and vomiting stopped at that time. Abdominal pain continued without melena, bloody stools, constipation or diarrhea, and he developed a fever tonight prompting return to the ED. No urinary symptoms, no respiratory symptoms, no chest pain.   The history is provided by the patient and the spouse. No language interpreter was used.  Fever   Associated symptoms include headaches (Reports HA is typical for his history of HA). Pertinent negatives include no chest pain, no vomiting and no cough.  Abdominal Pain   Associated symptoms include fever and headaches (Reports HA is typical for his history of HA). Pertinent negatives include nausea, vomiting, dysuria and myalgias.    Past Medical History:  Diagnosis Date  . Depression   . Hypertension   . Migraine     Patient Active Problem List   Diagnosis Date Noted  . Non-compliance 08/20/2015  . Medication overuse headache 06/12/2015  . Obesity 06/12/2015  . OSA (obstructive sleep apnea) 04/01/2014  . Other headache syndrome 12/26/2013  . Snoring 12/26/2013  . Hypersomnia 12/26/2013  . Cognitive complaints 12/26/2013  . History of suicide attempt 12/26/2013  . Suicidal thoughts 12/26/2013  . Morbid obesity (HCC) 12/26/2013    Past Surgical History:  Procedure Laterality Date  . HIP SURGERY  2012, 2013  . WRIST SURGERY  1988        Home Medications    Prior to Admission medications   Medication Sig Start Date End Date Taking? Authorizing Provider    Cetirizine HCl 10 MG TBDP Take 1 tablet by mouth daily.    [provider]  dicyclomine (BENTYL) 20 MG tablet Take 1 tablet (20 mg total) by mouth 2 (two) times daily. 12/26/17   Palumbo, April, MD  fluticasone (FLONASE) 50 MCG/ACT nasal spray Place 2 sprays into both nostrils daily. 12/05/13   [provider]  HYDROcodone-acetaminophen (NORCO/VICODIN) 5-325 MG tablet Take 2 tablets by mouth every 4 (four) hours as needed. 12/15/14   Danelle Berry, PA-C  INVOKAMET 150-500 MG TABS  12/19/14   [provider]  losartan-hydrochlorothiazide (HYZAAR) 50-12.5 MG per tablet Take 1 tablet by mouth daily. 11/28/13   [provider]  naproxen (NAPROSYN) 500 MG tablet Take 1 tablet (500 mg total) by mouth 2 (two) times daily. 10/21/15   Linna Hoff, MD  omeprazole (PRILOSEC) 20 MG capsule Take 1 capsule by mouth daily. 11/28/13   [provider]  PROAIR HFA 108 (90 BASE) MCG/ACT inhaler Inhale 1 puff into the lungs every 6 (six) hours as needed. 11/26/13   [provider]  simvastatin (ZOCOR) 10 MG tablet Take 1 tablet by mouth daily. 12/17/13   [provider]  topiramate (TOPAMAX) 50 MG tablet Take 1 tablet (50 mg total) by mouth 2 (two) times daily. 08/08/14   Anson Fret, MD  zolpidem (AMBIEN) 10 MG tablet Take 10 mg by mouth 3 (three) times a week. 08/18/15   [provider]    Family History Family History  Problem  Relation Age of Onset  . Other Father        blunt force trauma  . Migraines Neg Hx     Social History Social History   Tobacco Use  . Smoking status: Former Smoker    Last attempt to quit: 03/15/2004    Years since quitting: 13.7  . Smokeless tobacco: Never Used  Substance Use Topics  . Alcohol use: Yes    Comment: drink every 2 months  . Drug use: No     Allergies   Patient has no known allergies.   Review of Systems Review of Systems  Constitutional: Positive for fever.  HENT: Negative.    Respiratory: Negative for cough, choking and shortness of breath.   Cardiovascular: Negative for chest pain.  Gastrointestinal: Positive for abdominal pain. Negative for nausea and vomiting.  Genitourinary: Negative.  Negative for dysuria and flank pain.  Musculoskeletal: Negative for myalgias.  Skin: Negative.   Neurological: Positive for headaches (Reports HA is typical for his history of HA).     Physical Exam Updated Vital Signs BP 126/72   Pulse (!) 128   Temp (!) 101.6 F (38.7 C) (Oral)   Resp 20   Ht 6\' 1"  (1.854 m)   Wt 131.5 kg   SpO2 91%   BMI 38.26 kg/m   Physical Exam  Constitutional: He is oriented to person, place, and time. He appears well-developed and well-nourished.  HENT:  Head: Normocephalic.  Neck: Normal range of motion. Neck supple.  Cardiovascular: Regular rhythm. Tachycardia present.  No murmur heard. Pulmonary/Chest: Effort normal and breath sounds normal. He has no wheezes. He has no rales. He exhibits no tenderness.  Abdominal: Soft. Bowel sounds are normal. There is tenderness (Diffusely tender). There is no rebound and no guarding.  Musculoskeletal: Normal range of motion. He exhibits no edema.  Neurological: He is alert and oriented to person, place, and time.  Skin: Skin is warm and dry. No rash noted.  Psychiatric: He has a normal mood and affect.     ED Treatments / Results  Labs (all labs ordered are listed, but only abnormal results are displayed) Labs Reviewed  LIPASE, BLOOD  COMPREHENSIVE METABOLIC PANEL  URINALYSIS, ROUTINE W REFLEX MICROSCOPIC  CBC WITH DIFFERENTIAL/PLATELET    EKG None  Radiology Dg Chest 2 View  Result Date: 12/28/2017 CLINICAL DATA:  Shortness of breath and mid chest pain since this afternoon. EXAM: CHEST - 2 VIEW COMPARISON:  12/26/2017 FINDINGS: Normal heart size and pulmonary vascularity. No focal airspace disease or consolidation in the lungs. No blunting of costophrenic angles. No pneumothorax.  Mediastinal contours appear intact. Degenerative changes in the spine. IMPRESSION: No active cardiopulmonary disease. Electronically Signed   By: Burman Nieves M.D.   On: 12/28/2017 01:09   Dg Chest Portable 1 View  Result Date: 12/26/2017 CLINICAL DATA:  Left lower abdominal pain, shortness of breath, and central chest pain for 2 days. EXAM: PORTABLE CHEST 1 VIEW COMPARISON:  None. FINDINGS: Shallow inspiration. The heart size and mediastinal contours are within normal limits. Both lungs are clear. The visualized skeletal structures are unremarkable. IMPRESSION: No active disease. Electronically Signed   By: Burman Nieves M.D.   On: 12/26/2017 05:29   Ct Angio Chest/abd/pel For Dissection W And/or Wo Contrast  Result Date: 12/26/2017 CLINICAL DATA:  Shortness of breath and chest pain EXAM: CT ANGIOGRAPHY CHEST, ABDOMEN AND PELVIS TECHNIQUE: Multidetector CT imaging through the chest, abdomen and pelvis was performed using the standard protocol during bolus  administration of intravenous contrast. Multiplanar reconstructed images and MIPs were obtained and reviewed to evaluate the vascular anatomy. CONTRAST:  ISOVUE-370 IOPAMIDOL (ISOVUE-370) INJECTION 76% COMPARISON:  None. FINDINGS: CTA CHEST FINDINGS Cardiovascular: --Heart: The heart size is normal.  There is nopericardial effusion. --Aorta: The course and caliber of the thoracic aorta are normal. There is no aortic atherosclerotic calcification. Precontrast images show no aortic intramural hematoma. There is no blood pool, dissection or penetrating ulcer demonstrated on arterial phase postcontrast imaging. There is a conventional 3 vessel aortic arch branching pattern. The proximal arch vessels are widely patent. --Pulmonary Arteries: Contrast timing is optimized for preferential opacification of the aorta. Within that limitation, normal central pulmonary arteries. Mediastinum/Nodes: No mediastinal, hilar or axillary lymphadenopathy. The  visualized thyroid and thoracic esophageal course are unremarkable. Lungs/Pleura: No pulmonary nodules or masses. No pleural effusion or pneumothorax. No focal airspace consolidation. No focal pleural abnormality. Musculoskeletal: No chest wall abnormality. No acute osseous findings. Review of the MIP images confirms the above findings. CTA ABDOMEN AND PELVIS FINDINGS VASCULAR Aorta: Normal caliber aorta without aneurysm, dissection, vasculitis or hemodynamically significant stenosis. There is no aortic atherosclerosis. Celiac: No aneurysm, dissection or hemodynamically significant stenosis. Normal branching pattern. SMA: Widely patent without dissection or stenosis. Renals: Single renal arteries bilaterally. No aneurysm, dissection, stenosis or evidence of fibromuscular dysplasia. IMA: Patent without abnormality. Inflow: No aneurysm, stenosis or dissection. Veins: Normal course and caliber of the major veins. Assessment is otherwise limited by the arterial dominant contrast phase. Review of the MIP images confirms the above findings. NON-VASCULAR Hepatobiliary: Left hepatic lobe hemangioma measures 4.2 cm. Normal gallbladder. Pancreas: Normal contours without ductal dilatation. No peripancreatic fluid collection. Spleen: Normal arterial phase splenic enhancement pattern. Adrenals/Urinary Tract: --Adrenal glands: Normal. --Right kidney/ureter: No hydronephrosis or perinephric stranding. No nephrolithiasis. No obstructing ureteral stones. --Left kidney/ureter: No hydronephrosis or perinephric stranding. No nephrolithiasis. No obstructing ureteral stones. --Urinary bladder: Unremarkable. Stomach/Bowel: --Stomach/Duodenum: No hiatal hernia or other gastric abnormality. Normal duodenal course and caliber. --Small bowel: No dilatation or inflammation. --Colon: No focal abnormality. --Appendix: Normal. Lymphatic:  No abdominal or pelvic lymphadenopathy. Reproductive: Pelvis obscured by streak artifact Musculoskeletal.  Bilateral total hip arthroplasties. Multilevel spinal degenerative disc disease including flowing osteophytes throughout most of the thoracic spine. Other: None Review of the MIP images confirms the above findings. IMPRESSION: 1. No acute aortic syndrome or other acute abnormality of the chest, abdomen or pelvis. 2. 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 3. Flowing for thoracic osteophytosis consistent with diffuse idiopathic skeletal hyperostosis. Electronically Signed   By: Deatra Robinson M.D.   On: 12/26/2017 06:20    Procedures Procedures (including critical care time)  Medications Ordered in ED Medications  acetaminophen (TYLENOL) tablet 650 mg (650 mg Oral Given 12/28/17 0055)     Initial Impression / Assessment and Plan / ED Course  I have reviewed the triage vital signs and the nursing notes.  Pertinent labs & imaging results that were available during my care of the patient were reviewed by me and considered in my medical decision making (see chart for details).     Patient to ED with complaint of fever, abdominal pain and headache. Seen 2 days ago when having chest tightness, nausea and vomiting. He reports these latter symptoms are resolved.   He is febrile, tachycardic, hypoxic meeting sepsis criteria. IVF's, cultures, abx are been initiated.   He has a significant leukocytosis of 21. CXR clear. Chart reviewed. He had a CTA of chest/abd/pel on 12/26/17  that was negative for acute process. He has been seen and examined by Dr. Elesa Massed, found to have pain specific to the RUQ abdomen. [place] ordered.   Fever and tachycardia has been persistent throughout ED encounter despite Tylenol and IVF's. US show gall stones with some wall edema. Given fever, abdominal pain, leukocytosis, abnormal liver functions and exam findings of RUQ tenderness, clinically he is felt to have acute cholecystitis. Surgery paged.   Discussed with Dr. Johna Sheriff who advises the surgical team will see him to  evaluate for admission and cholecystectomy. Patient and family updated.  Final Clinical Impressions(s) / ED Diagnoses   Final diagnoses:  None   1. Acute cholecystitis 2. Fever  ED Discharge Orders    None       Elpidio Anis, PA-C 12/28/17 0622    Ward, Layla Maw, DO 12/28/17 [phone]

## 2017-12-28 NOTE — Progress Notes (Signed)
A consult was received from an ED physician for cefepime and vancomycin per pharmacy dosing.  The patient's profile has been reviewed for ht/wt/allergies/indication/available labs.   A one time order has been placed for Cefepime 2 Gm and Vancomycin 2500 mg.  Further antibiotics/pharmacy consults should be ordered by admitting physician if indicated.                       Thank you, Lorenza Evangelist 12/28/2017  3:11 AM

## 2017-12-28 NOTE — H&P (Signed)
Raymond Trevino 01-28-1961  [id number].    Primary Care Provider: Dr. Nolene Ebbs Chief Complaint/Reason for Consult: acute cholecystitis  HPI:  This is a 57 yo severely obese black male with a history of HTN, DM, OSA, chronic hip pain on chronic narcotics also on disability for this, who began having epigastric and RUQ abdominal pain on Sunday after eating baked chicken and meatballs.  He then developed N/V, but unable to have a BM.  He states that he has had some SOB and chest tightness with this as well.  He noticed some sweats and chills.  He presented to the ED on Monday secondary to persistent pain.  He had a CTA to rule out any major issues.  This was negative and his gallbladder on this scan was normal appearing.  He did have a WBC of 13K at the time.  He was discharged home. He continued to have N/V and now some loose stools.  He has been unable to tolerate solid foods.  Trying to drink water and liquids.  His fevers have persisted with max being 103.6 at home.  His abdominal pain has persisted as well.  He noticed that over the last 2 days his urine has changed colors and looks more red.  This is new along with some dysuria.  He denies pain in his right groin or in his back that is different from his typical chronic back pain.  He presented back to the [facility] today with a fever over 102, tachycardia and a WBC of 21K.  An abdominal US was obtained which revealed mild wall thickening and minimal pericholecystic fluid with some gallstones.  He has significant fatty liver disease noted as well.  His LFTs are essentially normal.  TB is slightly up at 1.5, but otherwise unremarkable.  We have been called to evaluate the patient for further recommendations.  ROS: ROS: Please see HPI, otherwise essentially negative.  He does have DM and states last A1C was around 9.  Says most CBGs at home are in the 130s.  Recently changed from norco to oxy apparently, although that has not been reflected in  his med list currently.  Patient does state "I sleep on as many pillows as I can find" to help with breathing at night.  He does wear his CPAP.  Family History  Problem Relation Age of Onset  . Other Father        blunt force trauma  . Migraines Neg Hx     Past Medical History:  Diagnosis Date  . Chronic hip pain    on chronic narcotics  . Depression   . Diabetes mellitus without complication ([place])   . Fatty liver disease, nonalcoholic   . Hypertension   . Migraine   . OSA (obstructive sleep apnea)     Past Surgical History:  Procedure Laterality Date  . HIP SURGERY  2012, 2013  . WRIST SURGERY  1988    Social History:  reports that he quit smoking about 30 years ago. He has never used smokeless tobacco. He reports that he drinks alcohol. He reports that he does not use drugs.  Allergies: No Known Allergies   (Not in a hospital admission)   Physical Exam: Blood pressure 129/79, pulse (!) 106, temperature 99.5 F (37.5 C), temperature source Oral, resp. rate 13, height 6' 1"  (1.854 m), weight 131.5 kg, SpO2 95 %. General: pleasant, severely obese black male who is laying in bed in NAD, but  does look somewhat ill. HEENT: head is normocephalic, atraumatic.  Sclera are slightly injected.  PERRL.  Ears and nose without any masses or lesions.  Mouth is pink and dry.  [place] in nose at 2L. Heart: regular rhythm, but tachy in low 100s.  Normal s1,s2. No obvious murmurs, gallops, or rubs noted.  Palpable radial and pedal pulses bilaterally Lungs: CTAB, no wheezes, rhonchi, or rales noted.  Respiratory effort nonlabored, with [place] in place with 2L Abd: soft, tender in RUQ (no guarding or peritoneal signs), obese with some tympany in upper abdomen, +BS, no masses, hernias.  Unable to palpate organomegaly due to body habitus MS: all 4 extremities are symmetrical with no cyanosis, clubbing, or edema. Skin: warm and dry with no masses, lesions, or rashes Psych: A&Ox3 with an appropriate  affect.   Results for orders placed or performed during the hospital encounter of 12/28/17 (from the past 48 hour(s))  Lipase, blood     Status: None   Collection Time: 12/28/17  1:00 AM  Result Value Ref Range   Lipase 22 11 - 51 U/L    Comment: Performed at [facility], [place] [address] [place], [place] [address]  Comprehensive metabolic panel     Status: Abnormal   Collection Time: 12/28/17  1:00 AM  Result Value Ref Range   Sodium 132 (L) 135 - 145 mmol/L   Potassium 3.8 3.5 - 5.1 mmol/L   Chloride 96 (L) 98 - 111 mmol/L   CO2 24 22 - 32 mmol/L   Glucose, Bld 256 (H) 70 - 99 mg/dL   BUN 15 6 - 20 mg/dL   Creatinine, Ser 1.03 0.61 - 1.24 mg/dL   Calcium 8.8 (L) 8.9 - 10.3 mg/dL   Total Protein 8.4 (H) 6.5 - 8.1 g/dL   Albumin 3.7 3.5 - 5.0 g/dL   AST 35 15 - 41 U/L   ALT 49 (H) 0 - 44 U/L   Alkaline Phosphatase 73 38 - 126 U/L   Total Bilirubin 1.5 (H) 0.3 - 1.2 mg/dL   GFR calc non Af Amer >60 >60 mL/min   GFR calc Af Amer >60 >60 mL/min    Comment: (NOTE) The eGFR has been calculated using the CKD EPI equation. This calculation has not been validated in all clinical situations. eGFR's persistently <60 mL/min signify possible Chronic Kidney Disease.    Anion gap 12 5 - 15    Comment: Performed at [facility], [place] [address] [place], [place] [address]  CBC with Differential     Status: Abnormal   Collection Time: 12/28/17  1:15 AM  Result Value Ref Range   WBC 21.3 (H) 4.0 - 10.5 K/uL   RBC 5.12 4.22 - 5.81 MIL/uL   Hemoglobin 15.4 13.0 - 17.0 g/dL   HCT 47.9 39.0 - 52.0 %   MCV 93.6 80.0 - 100.0 fL   MCH 30.1 26.0 - 34.0 pg   MCHC 32.2 30.0 - 36.0 g/dL   RDW 15.0 11.5 - 15.5 %   Platelets 212 150 - 400 K/uL   nRBC 0.0 0.0 - 0.2 %   Neutrophils Relative % 81 %   Neutro Abs 17.1 (H) 1.7 - 7.7 K/uL   Lymphocytes Relative 6 %   Lymphs Abs 1.2 0.7 - 4.0 K/uL   Monocytes Relative 12 %   Monocytes Absolute 2.6 (H) 0.1 - 1.0  K/uL   Eosinophils Relative 0 %   Eosinophils Absolute 0.1 0.0 - 0.5 K/uL  Basophils Relative 0 %   Basophils Absolute 0.1 0.0 - 0.1 K/uL   Immature Granulocytes 1 %   Abs Immature Granulocytes 0.20 (H) 0.00 - 0.07 K/uL    Comment: Performed at [facility], [place] [address], [place], [place] [address]  Culture, blood (routine x 2)     Status: None (Preliminary result)   Collection Time: 12/28/17  2:22 AM  Result Value Ref Range   Specimen Description      BLOOD LEFT ARM Performed at [facility], [place] [address], [place], [place] [address]    Special Requests      BOTTLES DRAWN AEROBIC AND ANAEROBIC Blood Culture results may not be optimal due to an excessive volume of blood received in culture bottles Performed at [place] [address], [place], [place] [address]    Culture PENDING    Report Status PENDING   Lactic acid, plasma     Status: None   Collection Time: 12/28/17  2:42 AM  Result Value Ref Range   Lactic Acid, Venous 1.5 0.5 - 1.9 mmol/L    Comment: Performed at [facility], [place] [address] [place], [place] [address]  Urinalysis, Routine w reflex microscopic     Status: Abnormal   Collection Time: 12/28/17  3:13 AM  Result Value Ref Range   Color, Urine AMBER (A) YELLOW    Comment: BIOCHEMICALS MAY BE AFFECTED BY COLOR   APPearance HAZY (A) CLEAR   Specific Gravity, Urine 1.038 (H) 1.005 - 1.030   pH 6.0 5.0 - 8.0   Glucose, UA 150 (A) NEGATIVE mg/dL   Hgb urine dipstick MODERATE (A) NEGATIVE   Bilirubin Urine NEGATIVE NEGATIVE   Ketones, ur 20 (A) NEGATIVE mg/dL   Protein, ur >=300 (A) NEGATIVE mg/dL   Nitrite NEGATIVE NEGATIVE   Leukocytes, UA NEGATIVE NEGATIVE   RBC / HPF >50 (H) 0 - 5 RBC/hpf   WBC, UA 6-10 0 - 5 WBC/hpf   Bacteria, UA NONE SEEN NONE SEEN   Squamous Epithelial / LPF 0-5 0 - 5   Mucus PRESENT    Budding Yeast PRESENT     Comment: Performed at [facility], [place] [address], [place], [place] [address]   Dg Chest 2 View  Result Date: 12/28/2017 CLINICAL DATA:  Shortness of breath and mid chest pain since this afternoon. EXAM: CHEST - 2 VIEW COMPARISON:  12/26/2017 FINDINGS: Normal heart size and pulmonary vascularity. No focal airspace disease or consolidation in the lungs. No blunting of costophrenic angles. No pneumothorax. Mediastinal contours appear intact. Degenerative changes in the spine. IMPRESSION: No active cardiopulmonary disease. Electronically Signed   By: Lucienne Capers M.D.   On: 12/28/2017 01:09   US Abdomen Limited  Result Date: 12/28/2017 CLINICAL DATA:  Right upper quadrant pain. EXAM: ULTRASOUND ABDOMEN LIMITED RIGHT UPPER QUADRANT COMPARISON:  None. FINDINGS: Gallbladder: Cholelithiasis with stones measuring up to 8 mm in diameter. Borderline wall thickening with pericholecystic edema. Murphy's sign is negative. Common bile duct: Diameter: 6.3 mm, upper limits of normal. Proximal common bile duct is not very well seen due to fatty infiltration of the liver. Liver: Prominent increased hepatic parenchymal echotexture consistent with severe diffuse fatty infiltration. No definite focal lesions. Portal vein is patent on color Doppler imaging with normal direction of blood flow towards the liver. IMPRESSION: Cholelithiasis with mild pericholecystic edema. Murphy's sign is negative. Changes could reflect cholecystitis in the appropriate clinical setting. Severe diffuse fatty infiltration  of the liver. Electronically Signed   By: Lucienne Capers M.D.   On: 12/28/2017 05:57      Assessment/Plan Acute cholecystitis -admit to inpatient status and plan for lap chole with possible IOC today.  Given elevation in WBC, fevers of 102-103, suspect he will require more than one post op day to treat infection. -await blood cultures.  If positive, may need ID to weigh in on duration of therapy for bacteremia. -high risk cholecystitis,  will start on zosyn -prn medications for pain, HTN, sleeping, gas, constipation, etc -scheduled tylenol  -IVFs -recheck labs in am -preop EKG and CXR already completed and normal -I have explained the procedure, risks, and aftercare of cholecystectomy.  Risks include but are not limited to bleeding, infection, wound problems, anesthesia, diarrhea, bile leak, injury to common bile duct/liver/intestine.  He seems to understand and agrees to proceed.  DM - on SSI, CBGs Hematuria/proteinuria - likely related to infection, but will need repeat UA once infection resolved, ? Prior to discharge or with pcp in 1-2 weeks HTN - hold home meds for now and watch BP as it is ok for now.  Prn meds ordered in case needed OSA - CPAP ordered for night and home meds ordered as well Severe diffuse fatty liver disease - will need follow up with PCP  FEN - NPO VTE - SCDs/Lovenox to start tomorrow ID - Cefepime/Vanc 1 dose in ED; Zosyn 10-16 --[place], [facility] 12/28/2017, 8:11 AM Pager: [phone]

## 2017-12-28 NOTE — Anesthesia Preprocedure Evaluation (Addendum)
Anesthesia Evaluation  Patient identified by MRN, date of birth, ID band Patient awake    Reviewed: Allergy & Precautions, NPO status , Patient's Chart, lab work & pertinent test results  History of Anesthesia Complications Negative for: history of anesthetic complications  Airway Mallampati: II  TM Distance: >3 FB Neck ROM: Full    Dental no notable dental hx.    Pulmonary sleep apnea , former smoker,    Pulmonary exam normal breath sounds clear to auscultation       Cardiovascular hypertension, Pt. on medications Normal cardiovascular exam Rhythm:Regular Rate:Normal     Neuro/Psych  Headaches, PSYCHIATRIC DISORDERS Depression  Hx suicide attempt negative neurological ROS  negative psych ROS   GI/Hepatic negative GI ROS, (+)     substance abuse  ,   Endo/Other  diabetes, Poorly Controlled, Type 2, Oral Hypoglycemic Agents Obesity Hyponatremia   Renal/GU negative Renal ROS  negative genitourinary   Musculoskeletal negative musculoskeletal ROS (+) narcotic dependent Chronic hip pain on narcotics    Abdominal (+) + obese,   Peds negative pediatric ROS (+)  Hematology negative hematology ROS (+)   Anesthesia Other Findings   Reproductive/Obstetrics negative OB ROS                           Anesthesia Physical Anesthesia Plan  ASA: III  Anesthesia Plan: General   Post-op Pain Management:    Induction: Intravenous  PONV Risk Score and Plan: 4 or greater and Treatment may vary due to age or medical condition, Ondansetron, Dexamethasone and Midazolam  Airway Management Planned: Oral ETT  Additional Equipment: None  Intra-op Plan:   Post-operative Plan: Extubation in OR  Informed Consent:   Plan Discussed with: CRNA and Anesthesiologist  Anesthesia Plan Comments:         Anesthesia Quick Evaluation

## 2017-12-28 NOTE — ED Notes (Signed)
ED TO INPATIENT HANDOFF REPORT  Name/Age/Gender Raymond Trevino 57 y.o. male  Code Status   Home/SNF/Other Home  Chief Complaint fever  Level of Care/Admitting Diagnosis ED Disposition    ED Disposition Condition Comment   Admit  Hospital Area: [place] [[id number]]  Level of Care: Med-Surg [16]  Diagnosis: Acute cholecystitis [575.0.ICD-9-CM]  Admitting Physician: CCS, [place]  Attending Physician: CCS, MD [[id number]]  Estimated length of stay: past midnight tomorrow  Certification:: I certify this patient will need inpatient services for at least 2 midnights  Bed request comments: 5W  PT Class (Do Not Modify): Inpatient [101]  PT Acc Code (Do Not Modify): Private [1]       Medical History Past Medical History:  Diagnosis Date  . Chronic hip pain    on chronic narcotics  . Depression   . Diabetes mellitus without complication ([place])   . Fatty liver disease, nonalcoholic   . Hypertension   . Migraine   . OSA (obstructive sleep apnea)     Allergies No Known Allergies  IV Location/Drains/Wounds Patient Lines/Drains/Airways Status   Active Line/Drains/Airways    Name:   Placement date:   Placement time:   Site:   Days:   Peripheral IV 12/28/17 Left Antecubital   12/28/17    0255    Antecubital   less than 1          Labs/Imaging Results for orders placed or performed during the hospital encounter of 12/28/17 (from the past 48 hour(s))  Lipase, blood     Status: None   Collection Time: 12/28/17  1:00 AM  Result Value Ref Range   Lipase 22 11 - 51 U/L    Comment: Performed at [facility], [place] [address] [place], [place] [address]  Comprehensive metabolic panel     Status: Abnormal   Collection Time: 12/28/17  1:00 AM  Result Value Ref Range   Sodium 132 (L) 135 - 145 mmol/L   Potassium 3.8 3.5 - 5.1 mmol/L   Chloride 96 (L) 98 - 111 mmol/L   CO2 24 22 - 32 mmol/L   Glucose, Bld 256 (H) 70 - 99 mg/dL   BUN 15 6 -  20 mg/dL   Creatinine, Ser 1.03 0.61 - 1.24 mg/dL   Calcium 8.8 (L) 8.9 - 10.3 mg/dL   Total Protein 8.4 (H) 6.5 - 8.1 g/dL   Albumin 3.7 3.5 - 5.0 g/dL   AST 35 15 - 41 U/L   ALT 49 (H) 0 - 44 U/L   Alkaline Phosphatase 73 38 - 126 U/L   Total Bilirubin 1.5 (H) 0.3 - 1.2 mg/dL   GFR calc non Af Amer >60 >60 mL/min   GFR calc Af Amer >60 >60 mL/min    Comment: (NOTE) The eGFR has been calculated using the CKD EPI equation. This calculation has not been validated in all clinical situations. eGFR's persistently <60 mL/min signify possible Chronic Kidney Disease.    Anion gap 12 5 - 15    Comment: Performed at [facility], [place] [address], [place], [place] [address]  CBC with Differential     Status: Abnormal   Collection Time: 12/28/17  1:15 AM  Result Value Ref Range   WBC 21.3 (H) 4.0 - 10.5 K/uL   RBC 5.12 4.22 - 5.81 MIL/uL   Hemoglobin 15.4 13.0 - 17.0 g/dL   HCT 47.9 39.0 - 52.0 %   MCV 93.6 80.0 - 100.0 fL  MCH 30.1 26.0 - 34.0 pg   MCHC 32.2 30.0 - 36.0 g/dL   RDW 15.0 11.5 - 15.5 %   Platelets 212 150 - 400 K/uL   nRBC 0.0 0.0 - 0.2 %   Neutrophils Relative % 81 %   Neutro Abs 17.1 (H) 1.7 - 7.7 K/uL   Lymphocytes Relative 6 %   Lymphs Abs 1.2 0.7 - 4.0 K/uL   Monocytes Relative 12 %   Monocytes Absolute 2.6 (H) 0.1 - 1.0 K/uL   Eosinophils Relative 0 %   Eosinophils Absolute 0.1 0.0 - 0.5 K/uL   Basophils Relative 0 %   Basophils Absolute 0.1 0.0 - 0.1 K/uL   Immature Granulocytes 1 %   Abs Immature Granulocytes 0.20 (H) 0.00 - 0.07 K/uL    Comment: Performed at [facility], [place] [address], [place], [place] [address]  Culture, blood (routine x 2)     Status: None (Preliminary result)   Collection Time: 12/28/17  2:22 AM  Result Value Ref Range   Specimen Description      BLOOD LEFT ARM Performed at [facility], [place] [address], [place], [place] [address]    Special Requests      BOTTLES DRAWN AEROBIC AND  ANAEROBIC Blood Culture results may not be optimal due to an excessive volume of blood received in culture bottles Performed at [place] [address] [place], [place] [address]    Culture PENDING    Report Status PENDING   Lactic acid, plasma     Status: None   Collection Time: 12/28/17  2:42 AM  Result Value Ref Range   Lactic Acid, Venous 1.5 0.5 - 1.9 mmol/L    Comment: Performed at [facility], [place] [address], [place], [place] [address]  Urinalysis, Routine w reflex microscopic     Status: Abnormal   Collection Time: 12/28/17  3:13 AM  Result Value Ref Range   Color, Urine AMBER (A) YELLOW    Comment: BIOCHEMICALS MAY BE AFFECTED BY COLOR   APPearance HAZY (A) CLEAR   Specific Gravity, Urine 1.038 (H) 1.005 - 1.030   pH 6.0 5.0 - 8.0   Glucose, UA 150 (A) NEGATIVE mg/dL   Hgb urine dipstick MODERATE (A) NEGATIVE   Bilirubin Urine NEGATIVE NEGATIVE   Ketones, ur 20 (A) NEGATIVE mg/dL   Protein, ur >=300 (A) NEGATIVE mg/dL   Nitrite NEGATIVE NEGATIVE   Leukocytes, UA NEGATIVE NEGATIVE   RBC / HPF >50 (H) 0 - 5 RBC/hpf   WBC, UA 6-10 0 - 5 WBC/hpf   Bacteria, UA NONE SEEN NONE SEEN   Squamous Epithelial / LPF 0-5 0 - 5   Mucus PRESENT    Budding Yeast PRESENT     Comment: Performed at [facility], [place] [address] [place], [place] [address]   Dg Chest 2 View  Result Date: 12/28/2017 CLINICAL DATA:  Shortness of breath and mid chest pain since this afternoon. EXAM: CHEST - 2 VIEW COMPARISON:  12/26/2017 FINDINGS: Normal heart size and pulmonary vascularity. No focal airspace disease or consolidation in the lungs. No blunting of costophrenic angles. No pneumothorax. Mediastinal contours appear intact. Degenerative changes in the spine. IMPRESSION: No active cardiopulmonary disease. Electronically Signed   By: Lucienne Capers M.D.   On: 12/28/2017 01:09   US Abdomen Limited  Result Date: 12/28/2017 CLINICAL  DATA:  Right upper quadrant pain. EXAM: ULTRASOUND ABDOMEN LIMITED RIGHT UPPER QUADRANT COMPARISON:  None.  FINDINGS: Gallbladder: Cholelithiasis with stones measuring up to 8 mm in diameter. Borderline wall thickening with pericholecystic edema. Murphy's sign is negative. Common bile duct: Diameter: 6.3 mm, upper limits of normal. Proximal common bile duct is not very well seen due to fatty infiltration of the liver. Liver: Prominent increased hepatic parenchymal echotexture consistent with severe diffuse fatty infiltration. No definite focal lesions. Portal vein is patent on color Doppler imaging with normal direction of blood flow towards the liver. IMPRESSION: Cholelithiasis with mild pericholecystic edema. Murphy's sign is negative. Changes could reflect cholecystitis in the appropriate clinical setting. Severe diffuse fatty infiltration of the liver. Electronically Signed   By: Lucienne Capers M.D.   On: 12/28/2017 05:57    Pending Labs Unresulted Labs (From admission, onward)    Start     Ordered   12/28/17 0218  Urine culture  STAT,   STAT     12/28/17 0217   12/28/17 0217  Culture, blood (routine x 2)  BLOOD CULTURE X 2,   STAT     12/28/17 0217   Signed and Held  HIV antibody (Routine Testing)  Once,   R     Signed and Held   Signed and Held  Creatinine, serum  (enoxaparin (LOVENOX)    CrCl >/= 30 ml/min)  Weekly,   R    Comments:  while on enoxaparin therapy    Signed and Held   Signed and Held  Comprehensive metabolic panel  Tomorrow morning,   R     Signed and Held   Signed and Held  CBC  Tomorrow morning,   R     Signed and Held          Vitals/Pain Today's Vitals   12/28/17 0630 12/28/17 0647 12/28/17 0700 12/28/17 0728  BP: 121/81   129/79  Pulse: (!) 117  (!) 113 (!) 106  Resp:   20 13  Temp:  99.5 F (37.5 C)    TempSrc:  Oral    SpO2: 97%  97% 95%  Weight:      Height:      PainSc:        Isolation Precautions No active  isolations  Medications Medications  acetaminophen (TYLENOL) tablet 650 mg (650 mg Oral Given 12/28/17 0055)  sodium chloride 0.9 % bolus 1,000 mL (0 mLs Intravenous Stopped 12/28/17 0443)  ceFEPIme (MAXIPIME) 2 g in sodium chloride 0.9 % 100 mL IVPB (0 g Intravenous Stopped 12/28/17 0400)  sodium chloride 0.9 % bolus 1,000 mL (0 mLs Intravenous Stopped 12/28/17 0603)    And  sodium chloride 0.9 % bolus 1,000 mL (0 mLs Intravenous Stopped 12/28/17 0720)    And  sodium chloride 0.9 % bolus 1,000 mL (0 mLs Intravenous Stopped 12/28/17 0834)  vancomycin (VANCOCIN) 2,500 mg in sodium chloride 0.9 % 500 mL IVPB (0 mg Intravenous Stopped 12/28/17 0811)  fentaNYL (SUBLIMAZE) injection 50 mcg (50 mcg Intravenous Given 12/28/17 0515)  ibuprofen (ADVIL,MOTRIN) tablet 800 mg (800 mg Oral Given 12/28/17 0544)    Mobility walks

## 2017-12-28 NOTE — ED Provider Notes (Addendum)
Medical screening examination/treatment/procedure(s) were conducted as a shared visit with non-physician practitioner(s) and myself.  I personally evaluated the patient during the encounter.  EKG Interpretation  Date/Time:  Wednesday December 28 2017 06:57:33 EDT Ventricular Rate:  114 PR Interval:    QRS Duration: 100 QT Interval:  318 QTC Calculation: 438 R Axis:   -18 Text Interpretation:  Sinus tachycardia Borderline left axis deviation RSR' in V1 or V2, right VCD or RVH No significant change since last tracing Confirmed by Ward, Baxter Hire [id number]) on 12/28/2017 7:22:12 AM   Patient is a 57 year old male who presents to the emergency department with complaints of fevers, abdominal pain, vomiting.  Was seen in the emergency department for similar symptoms recently and had a CT that showed no acute abnormality.  On exam, patient is febrile, tachycardic with tenderness in the right upper quadrant with voluntary guarding.  Right upper quadrant ultrasound concerning for cholecystitis.  Discussed with general surgery.  Patient has received broad-spectrum antibiotics.   Ward, Layla Maw, DO 12/28/17 6045    Ward, Layla Maw, DO 12/28/17 [phone]

## 2017-12-28 NOTE — ED Notes (Signed)
Bed: WA18 Expected date:  Expected time:  Means of arrival:  Comments: Triage 1 

## 2017-12-28 NOTE — Transfer of Care (Signed)
Immediate Anesthesia Transfer of Care Note  Patient: Raymond Trevino  Procedure(s) Performed: LAPAROSCOPIC SUBTOTAL CHOLECYSTECTOMY (N/A )  Patient Location: PACU  Anesthesia Type:General  Level of Consciousness: awake, alert  and oriented  Airway & Oxygen Therapy: Patient Spontanous Breathing and Patient connected to face mask oxygen  Post-op Assessment: Report given to RN and Post -op Vital signs reviewed and stable  Post vital signs: Reviewed and stable  Last Vitals:  Vitals Value Taken Time  BP 137/88 12/28/2017  4:59 PM  Temp    Pulse 112 12/28/2017  5:00 PM  Resp 18 12/28/2017  5:01 PM  SpO2 95 % 12/28/2017  5:00 PM  Vitals shown include unvalidated device data.  Last Pain:  Vitals:   12/28/17 1339  TempSrc: Oral  PainSc: 8       Patients Stated Pain Goal: 2 (12/28/17 1230)  Complications: No apparent anesthesia complications

## 2017-12-29 ENCOUNTER — Encounter (HOSPITAL_COMMUNITY): Payer: Self-pay | Admitting: General Surgery

## 2017-12-29 LAB — COMPREHENSIVE METABOLIC PANEL
ALK PHOS: 65 U/L (ref 38–126)
ALT: 116 U/L — ABNORMAL HIGH (ref 0–44)
ANION GAP: 9 (ref 5–15)
AST: 107 U/L — ABNORMAL HIGH (ref 15–41)
Albumin: 3 g/dL — ABNORMAL LOW (ref 3.5–5.0)
BUN: 15 mg/dL (ref 6–20)
CHLORIDE: 106 mmol/L (ref 98–111)
CO2: 21 mmol/L — AB (ref 22–32)
Calcium: 8.4 mg/dL — ABNORMAL LOW (ref 8.9–10.3)
Creatinine, Ser: 1.13 mg/dL (ref 0.61–1.24)
Glucose, Bld: 263 mg/dL — ABNORMAL HIGH (ref 70–99)
Potassium: 4.2 mmol/L (ref 3.5–5.1)
Sodium: 136 mmol/L (ref 135–145)
Total Bilirubin: 0.8 mg/dL (ref 0.3–1.2)
Total Protein: 7.2 g/dL (ref 6.5–8.1)

## 2017-12-29 LAB — GLUCOSE, CAPILLARY
GLUCOSE-CAPILLARY: 295 mg/dL — AB (ref 70–99)
GLUCOSE-CAPILLARY: 268 mg/dL — AB (ref 70–99)
Glucose-Capillary: 263 mg/dL — ABNORMAL HIGH (ref 70–99)
Glucose-Capillary: 163 mg/dL — ABNORMAL HIGH (ref 70–99)

## 2017-12-29 LAB — CBC
HCT: 41.2 % (ref 39.0–52.0)
Hemoglobin: 13 g/dL (ref 13.0–17.0)
MCH: 29.7 pg (ref 26.0–34.0)
MCHC: 31.6 g/dL (ref 30.0–36.0)
MCV: 94.3 fL (ref 80.0–100.0)
NRBC: 0 % (ref 0.0–0.2)
Platelets: 183 10*3/uL (ref 150–400)
RBC: 4.37 MIL/uL (ref 4.22–5.81)
RDW: 15.5 % (ref 11.5–15.5)
WBC: 15.5 10*3/uL — AB (ref 4.0–10.5)

## 2017-12-29 LAB — URINE CULTURE

## 2017-12-29 LAB — HIV ANTIBODY (ROUTINE TESTING W REFLEX): HIV Screen 4th Generation wRfx: NONREACTIVE

## 2017-12-29 MED ORDER — GLUCERNA SHAKE PO LIQD
237.0000 mL | Freq: Three times a day (TID) | ORAL | Status: DC
  Administered 2017-12-29 (×2): 237 mL via ORAL
  Filled 2017-12-29 (×6): qty 237

## 2017-12-29 NOTE — Progress Notes (Addendum)
[facility] Progress Note  1 Day Post-Op  Subjective: CC: Pt POD1 from lap chole and drain placement. Patient reports doing "okay" this AM. He describes considerable pain overnight that has mildly improved to 8/10 this morning. He has received pain meds PO. He is ambulating without issue this morning and has had 1 normal BM.  He denies NVD.   Objective: Vital signs in last 24 hours: Temp:  [98.3 F (36.8 C)-100 F (37.8 C)] 99 F (37.2 C) (10/17 0743) Pulse Rate:  [98-114] 98 (10/17 0645) Resp:  [13-27] 18 (10/17 0645) BP: (119-155)/(58-101) 119/79 (10/17 0645) SpO2:  [92 %-99 %] 94 % (10/17 0819) Weight:  [161 kg] 127 kg (10/16 1339) Last BM Date: 12/28/17  Intake/Output from previous day: 10/16 0701 - 10/17 0700 In: 4417.5 [P.O.:1220; I.V.:3039.1; IV Piggyback:158.4] Out: 3330 [Urine:2675; Drains:505; Blood:150] Intake/Output this shift: No intake/output data recorded.  PE: Gen:  Alert, NAD, pleasant and sitting upright comfortably in a chair Card:  Regular rate and rhythm Pulm:  Normal effort, clear to auscultation bilaterally Abd: Soft, mildly tender TTP, non-distended, bowel sounds present in all 4 quadrants, incisions C/D/I, Drain in place with slight biliary appearance to the fluid. Site of drain insertion is clean and intact. Skin: warm and dry, no rashes  Psych: A&Ox3   Lab Results:  Recent Labs    12/28/17 0115 12/29/17 0443  WBC 21.3* 15.5*  HGB 15.4 13.0  HCT 47.9 41.2  PLT 212 183   BMET Recent Labs    12/28/17 0100 12/29/17 0443  NA 132* 136  K 3.8 4.2  CL 96* 106  CO2 24 21*  GLUCOSE 256* 263*  BUN 15 15  CREATININE 1.03 1.13  CALCIUM 8.8* 8.4*   PT/INR No results for input(s): LABPROT, INR in the last 72 hours. CMP     Component Value Date/Time   NA 136 12/29/2017 0443   NA 138 01/22/2015 1350   K 4.2 12/29/2017 0443   CL 106 12/29/2017 0443   CO2 21 (L) 12/29/2017 0443   GLUCOSE 263 (H) 12/29/2017 0443   BUN 15  12/29/2017 0443   BUN 13 01/22/2015 1350   CREATININE 1.13 12/29/2017 0443   CALCIUM 8.4 (L) 12/29/2017 0443   PROT 7.2 12/29/2017 0443   PROT 7.8 01/22/2015 1350   ALBUMIN 3.0 (L) 12/29/2017 0443   ALBUMIN 4.7 01/22/2015 1350   AST 107 (H) 12/29/2017 0443   ALT 116 (H) 12/29/2017 0443   ALKPHOS 65 12/29/2017 0443   BILITOT 0.8 12/29/2017 0443   BILITOT 0.5 01/22/2015 1350   GFRNONAA >60 12/29/2017 0443   GFRAA >60 12/29/2017 0443   Lipase     Component Value Date/Time   LIPASE 22 12/28/2017 0100       Studies/Results: Dg Chest 2 View  Result Date: 12/28/2017 CLINICAL DATA:  Shortness of breath and mid chest pain since this afternoon. EXAM: CHEST - 2 VIEW COMPARISON:  12/26/2017 FINDINGS: Normal heart size and pulmonary vascularity. No focal airspace disease or consolidation in the lungs. No blunting of costophrenic angles. No pneumothorax. Mediastinal contours appear intact. Degenerative changes in the spine. IMPRESSION: No active cardiopulmonary disease. Electronically Signed   By: Burman Nieves M.D.   On: 12/28/2017 01:09   US Abdomen Limited  Result Date: 12/28/2017 CLINICAL DATA:  Right upper quadrant pain. EXAM: ULTRASOUND ABDOMEN LIMITED RIGHT UPPER QUADRANT COMPARISON:  None. FINDINGS: Gallbladder: Cholelithiasis with stones measuring up to 8 mm in diameter. Borderline wall thickening with pericholecystic edema. Murphy's  sign is negative. Common bile duct: Diameter: 6.3 mm, upper limits of normal. Proximal common bile duct is not very well seen due to fatty infiltration of the liver. Liver: Prominent increased hepatic parenchymal echotexture consistent with severe diffuse fatty infiltration. No definite focal lesions. Portal vein is patent on color Doppler imaging with normal direction of blood flow towards the liver. IMPRESSION: Cholelithiasis with mild pericholecystic edema. Murphy's sign is negative. Changes could reflect cholecystitis in the appropriate clinical  setting. Severe diffuse fatty infiltration of the liver. Electronically Signed   By: Burman Nieves M.D.   On: 12/28/2017 05:57    Anti-infectives: Anti-infectives (From admission, onward)   Start     Dose/Rate Route Frequency Ordered Stop   12/28/17 1000  piperacillin-tazobactam (ZOSYN) IVPB 3.375 g     3.375 g 12.5 mL/hr over 240 Minutes Intravenous Every 8 hours 12/28/17 0856     12/28/17 0315  ceFEPIme (MAXIPIME) 2 g in sodium chloride 0.9 % 100 mL IVPB     2 g 200 mL/hr over 30 Minutes Intravenous  Once 12/28/17 0301 12/28/17 0400   12/28/17 0315  metroNIDAZOLE (FLAGYL) IVPB 500 mg  Status:  Discontinued     500 mg 100 mL/hr over 60 Minutes Intravenous Every 8 hours 12/28/17 0301 12/28/17 0831   12/28/17 0315  vancomycin (VANCOCIN) IVPB 1000 mg/200 mL premix  Status:  Discontinued     1,000 mg 200 mL/hr over 60 Minutes Intravenous  Once 12/28/17 0301 12/28/17 0309   12/28/17 0315  vancomycin (VANCOCIN) 2,500 mg in sodium chloride 0.9 % 500 mL IVPB     2,500 mg 250 mL/hr over 120 Minutes Intravenous  Once 12/28/17 0309 12/28/17 0811       Assessment/Plan -S/p successful lap chole for acute cholecystitis. POD1 -Pt is doing well overall. He reports no NVD, ambulates without difficulty and has had a normal BM this AM. His pain seems appropriate given the procedure.  -Drain is in place. Insertion site is clean, intact and drain is emptying properly. -LFTs elevated (AST: 107, ALT: 116) Most likely due to the procedure. Anticipate these labs to trend back down. -WBC is down to 15.5 from 21.3 -Hyperglycemic: Glucose elevated at 263, but this appears to be his baseline. Will have him f/u with his PCP on this.  -Plan to d/c home today with drain still in place if pt continues to do well. -F/u in office 2 weeks. Possible drain removal at this time. -Pain meds prn -Will assess need for continued abx at d/c -Discussed keeping drain insertion site clean/dry, and he should empty the bag  2-3x per day. -Discussed advancing diet diet slowly and to avoid heavy, greasy or fatty foods. Advised the patient to avoid lifting anything over 15-20lbs over the next few weeks and to increase activity as tolerated. -He should come in to be seen sooner should his pain worsen, develops a fever, NV, notices redness or swelling at any of his incision sites, or if he has any issues with his drain.   LOS: 1 day    Liana Crocker, PA-S

## 2017-12-29 NOTE — Progress Notes (Signed)
I have reviewed and concur with this student's documentation.   

## 2017-12-29 NOTE — Progress Notes (Signed)
Pt refused cpap tonight.  Pt was advised that RT is available all night should he change his mind.  RN aware. 

## 2017-12-30 LAB — GLUCOSE, CAPILLARY
GLUCOSE-CAPILLARY: 135 mg/dL — AB (ref 70–99)
Glucose-Capillary: 205 mg/dL — ABNORMAL HIGH (ref 70–99)
Glucose-Capillary: 194 mg/dL — ABNORMAL HIGH (ref 70–99)

## 2017-12-30 MED ORDER — ACETAMINOPHEN 500 MG PO TABS: 1000.0000 mg | ORAL_TABLET | Freq: Four times a day (QID) | ORAL | 0 refills | Status: DC | PRN

## 2017-12-30 MED ORDER — IBUPROFEN 200 MG PO TABS: 400.0000 mg | ORAL_TABLET | Freq: Three times a day (TID) | ORAL | 0 refills | Status: AC | PRN

## 2017-12-30 MED ORDER — OXYCODONE HCL 5 MG PO TABS: 5.0000 mg | ORAL_TABLET | Freq: Four times a day (QID) | ORAL | 0 refills | Status: AC | PRN

## 2017-12-30 MED ORDER — AMOXICILLIN-POT CLAVULANATE 875-125 MG PO TABS: 1.0000 | ORAL_TABLET | Freq: Two times a day (BID) | ORAL | 0 refills | Status: AC

## 2017-12-30 MED ORDER — ACETAMINOPHEN 500 MG PO TABS: 1000.0000 mg | ORAL_TABLET | Freq: Three times a day (TID) | ORAL | 0 refills | Status: AC | PRN

## 2017-12-30 NOTE — Progress Notes (Signed)
Patient reporting his JP drain is consistently leaking. He is upset that he is getting dirty with drainage fluid. Dressing changed and new gown provided. JP drained and recharged. While still in the room the patient opened the stop and let out the suction stating that he's been fixing it all day. I educated him on the purpose of the JP drain and how it works. He has agreed not to release the stop anymore and is appreciative of the education. Will continue to monitor.

## 2017-12-30 NOTE — Plan of Care (Signed)
Reviewed discharge instructions with patient. IV removed. Patient ready for discharge.  

## 2017-12-30 NOTE — Discharge Instructions (Signed)
[facility] °LAPAROSCOPIC SURGERY: POST OP INSTRUCTIONS °Always review your discharge instruction sheet given to you by the facility where your surgery was performed. °IF YOU HAVE DISABILITY OR FAMILY LEAVE FORMS, YOU MUST BRING THEM TO THE OFFICE FOR PROCESSING.   °DO NOT GIVE THEM TO YOUR DOCTOR. ° °PAIN CONTROL ° °1. First take acetaminophen (Tylenol) AND/or ibuprofen (Advil) to control your pain after surgery.  Follow directions on package.  Taking acetaminophen (Tylenol) and/or ibuprofen (Advil) regularly after surgery will help to control your pain and lower the amount of prescription pain medication you may need.  You should not take more than 4,000 mg (4 grams) of acetaminophen (Tylenol) in 24 hours.  You should not take ibuprofen (Advil), aleve, motrin, naprosyn or other NSAIDS if you have a history of stomach ulcers or chronic kidney disease.  °2. A prescription for pain medication may be given to you upon discharge.  Take your pain medication as prescribed, if you still have uncontrolled pain after taking acetaminophen (Tylenol) or ibuprofen (Advil). °3. Use ice packs to help control pain. °4. If you need a refill on your pain medication, please contact your pharmacy.  They will contact our office to request authorization. Prescriptions will not be filled after 5pm or on week-ends. ° °HOME MEDICATIONS °5. Take your usually prescribed medications unless otherwise directed. ° °DIET °6. You should follow a light diet the first few days after arrival home.  Be sure to include lots of fluids daily. Avoid fatty, fried foods.  ° °CONSTIPATION °7. It is common to experience some constipation after surgery and if you are taking pain medication.  Increasing fluid intake and taking a stool softener (such as Colace) will usually help or prevent this problem from occurring.  A mild laxative (Milk of Magnesia or Miralax) should be taken according to package instructions if there are no bowel  movements after 48 hours. ° °WOUND/INCISION CARE °8. Most patients will experience some swelling and bruising in the area of the incisions.  Ice packs will help.  Swelling and bruising can take several days to resolve.  °9. Unless discharge instructions indicate otherwise, follow guidelines below  °a. STERI-STRIPS - you may remove your outer bandages 48 hours after surgery, and you may shower at that time.  You have steri-strips (small skin tapes) in place directly over the incision.  These strips should be left on the skin for 7-10 days.   °b. DERMABOND/SKIN GLUE - you may shower in 24 hours.  The glue will flake off over the next 2-3 weeks. °10. Any sutures or staples will be removed at the office during your follow-up visit. ° °ACTIVITIES °11. You may resume regular (light) daily activities beginning the next day--such as daily self-care, walking, climbing stairs--gradually increasing activities as tolerated.  You may have sexual intercourse when it is comfortable.  Refrain from any heavy lifting or straining until approved by your doctor. °a. You may drive when you are no longer taking prescription pain medication, you can comfortably wear a seatbelt, and you can safely maneuver your car and apply brakes. ° °FOLLOW-UP °12. You should see your doctor in the office for a follow-up appointment approximately 2-3 weeks after your surgery.  You should have been given your post-op/follow-up appointment when your surgery was scheduled.  If you did not receive a post-op/follow-up appointment, make sure that you call for this appointment within a day or two after you arrive home to insure a convenient appointment time. ° °OTHER   INSTRUCTIONS °13.  ° °WHEN TO CALL YOUR DOCTOR: °1. Fever over 101.0 °2. Inability to urinate °3. Continued bleeding from incision. °4. Increased pain, redness, or drainage from the incision. °5. Increasing abdominal pain ° °The clinic staff is available to answer your questions during regular  business hours.  Please don’t hesitate to call and ask to speak to one of the nurses for clinical concerns.  If you have a medical emergency, go to the nearest emergency room or call 911.  A surgeon from [facility] is always on call at the hospital. °[address], [address], [place], [place]  [address] ? [address] [address], [place], [place]   [address] °[phone] ? [phone] ? FAX [phone] °Web site: [email or web address] ° °

## 2017-12-30 NOTE — Progress Notes (Addendum)
[facility] Progress Note  2 Days Post-Op  Subjective: Pt reports "doing better" today. His drain leaked last night, but his nurse was able to adjust the tube and applied a new dressing. It has not leaked since. He reports considerable pain again overnight, but says it has improved this morning and rates it 7/10. His appetite has increased and he denies NVD. He has been passing gas "frequently" and has had a BM this morning. He continues to ambulate without issue.   Objective: Vital signs in last 24 hours: Temp:  [98.3 F (36.8 C)-98.8 F (37.1 C)] 98.6 F (37 C) (10/18 0556) Pulse Rate:  [83-85] 83 (10/18 0556) Resp:  [17-20] 18 (10/18 0556) BP: (97-118)/(59-77) 118/77 (10/18 0556) SpO2:  [94 %-99 %] 99 % (10/18 0556) Last BM Date: 12/29/17  Intake/Output from previous day: 10/17 0701 - 10/18 0700 In: 1234.7 [P.O.:240; I.V.:902.9; IV Piggyback:91.8] Out: 1230 [Urine:950; Drains:280] Intake/Output this shift: Total I/O In: -  Out: 480 [Urine:400; Drains:80]  PE: Gen:  Alert, NAD, pleasant, lying in bed Card:  Regular rate and rhythm Pulm:  Normal effort, clear to auscultation bilaterally Abd: Soft, non-tender, non-distended, bowel sounds present in all 4 quadrants, incisions C/D/I. Drain is emptying properly with bilious tinge. Insertion site is clean, dry and intact. Skin: warm and dry, no rashes  Psych: A&Ox3   Lab Results:  Recent Labs    12/28/17 0115 12/29/17 0443  WBC 21.3* 15.5*  HGB 15.4 13.0  HCT 47.9 41.2  PLT 212 183   BMET Recent Labs    12/28/17 0100 12/29/17 0443  NA 132* 136  K 3.8 4.2  CL 96* 106  CO2 24 21*  GLUCOSE 256* 263*  BUN 15 15  CREATININE 1.03 1.13  CALCIUM 8.8* 8.4*   PT/INR No results for input(s): LABPROT, INR in the last 72 hours. CMP     Component Value Date/Time   NA 136 12/29/2017 0443   NA 138 01/22/2015 1350   K 4.2 12/29/2017 0443   CL 106 12/29/2017 0443   CO2 21 (L) 12/29/2017 0443   GLUCOSE 263  (H) 12/29/2017 0443   BUN 15 12/29/2017 0443   BUN 13 01/22/2015 1350   CREATININE 1.13 12/29/2017 0443   CALCIUM 8.4 (L) 12/29/2017 0443   PROT 7.2 12/29/2017 0443   PROT 7.8 01/22/2015 1350   ALBUMIN 3.0 (L) 12/29/2017 0443   ALBUMIN 4.7 01/22/2015 1350   AST 107 (H) 12/29/2017 0443   ALT 116 (H) 12/29/2017 0443   ALKPHOS 65 12/29/2017 0443   BILITOT 0.8 12/29/2017 0443   BILITOT 0.5 01/22/2015 1350   GFRNONAA >60 12/29/2017 0443   GFRAA >60 12/29/2017 0443   Lipase     Component Value Date/Time   LIPASE 22 12/28/2017 0100     Studies/Results: No results found.  Anti-infectives: Anti-infectives (From admission, onward)   Start     Dose/Rate Route Frequency Ordered Stop   12/28/17 1000  piperacillin-tazobactam (ZOSYN) IVPB 3.375 g     3.375 g 12.5 mL/hr over 240 Minutes Intravenous Every 8 hours 12/28/17 0856     12/28/17 0315  ceFEPIme (MAXIPIME) 2 g in sodium chloride 0.9 % 100 mL IVPB     2 g 200 mL/hr over 30 Minutes Intravenous  Once 12/28/17 0301 12/28/17 0400   12/28/17 0315  metroNIDAZOLE (FLAGYL) IVPB 500 mg  Status:  Discontinued     500 mg 100 mL/hr over 60 Minutes Intravenous Every 8 hours 12/28/17 0301 12/28/17 0831  12/28/17 0315  vancomycin (VANCOCIN) IVPB 1000 mg/200 mL premix  Status:  Discontinued     1,000 mg 200 mL/hr over 60 Minutes Intravenous  Once 12/28/17 0301 12/28/17 0309   12/28/17 0315  vancomycin (VANCOCIN) 2,500 mg in sodium chloride 0.9 % 500 mL IVPB     2,500 mg 250 mL/hr over 120 Minutes Intravenous  Once 12/28/17 0309 12/28/17 0811       Assessment/Plan -S/p successful lap chole for acute cholecystitis. POD2 -Pt continues to improve. He is tolerating PO, reports no NVD, continues to ambulate without difficulty, passing gas and has had a normal BM this AM. His pain is appropriate given the procedure.  -Drain is in place. Insertion site is clean, intact and drain is emptying properly. -Hyperglycemic: Glucose remains elevated this  AM at 206, but this appears to be his baseline. Will have him f/u with his PCP on this.  -Plan to d/c home today with drain still in place if pt continues to do well. -Pain meds prn -PO augmentin home x 7 days -Discussed keeping drain insertion site clean/dry. Wound care to discuss drain care prior to d/c. -Discussed advancing diet diet slowly and to avoid heavy, greasy or fatty foods. Advised the patient to avoid lifting anything over 15-20lbs over the next few weeks and to increase activity as tolerated.  -F/u in office 2 weeks. Will evaluate for drain removal at this time. -He should come in to be seen sooner should his pain worsen, develop a fever, NV, notices redness or swelling at any of his incision sites, or if he has any issues with his drain.  LOS: 2 days    Liana Crocker, PA-S

## 2017-12-30 NOTE — Plan of Care (Signed)
Patient in bed this morning. Complaining of pain 8-9/10 and requesting pain medication. Will continue to monitor.

## 2018-01-02 LAB — CULTURE, BLOOD (ROUTINE X 2)
CULTURE: NO GROWTH
CULTURE: NO GROWTH
SPECIAL REQUESTS: ADEQUATE

## 2018-01-09 NOTE — Discharge Summary (Addendum)
[facility] Discharge Summary   Patient ID: Raymond Trevino MRN: [id number] DOB/AGE: 12-01-1960 57 y.o.  Admit date: 12/28/2017 Discharge date: 12/30/2017  Discharge Diagnosis Patient Active Problem List   Diagnosis Date Noted  . Acute cholecystitis 12/28/2017  . Non-compliance 08/20/2015  . Medication overuse headache 06/12/2015  . Obesity 06/12/2015  . OSA (obstructive sleep apnea) 04/01/2014  . Other headache syndrome 12/26/2013  . Snoring 12/26/2013  . Hypersomnia 12/26/2013  . Cognitive complaints 12/26/2013  . History of suicide attempt 12/26/2013  . Suicidal thoughts 12/26/2013  . Morbid obesity (HCC) 12/26/2013    Procedures Dr. Franky Macho Kinsinger (12/28/17) - Laparoscopic subtotal cholecystectomy, placement of blake drain.   Hospital Course:  57 yo male with 5 days of abdominal pain, nausea, vomiting, fevers. He was found to have a leukocytosis, [place] consistent with cholecystitis. Admitted and underwent above procedure above. On POD#2 the patients pain was controlled, tolerating PO, mobilizing, WBC trending down, and stable for discharge. He was discharged home with his drain in place and follow up as below.    Allergies as of 12/30/2017   No Known Allergies     Medication List    TAKE these medications   acetaminophen 500 MG tablet Commonly known as:  TYLENOL Take 2 tablets (1,000 mg total) by mouth every 8 (eight) hours as needed.   Cetirizine HCl 10 MG Tbdp Take 1 tablet by mouth daily.   dicyclomine 20 MG tablet Commonly known as:  BENTYL Take 1 tablet (20 mg total) by mouth 2 (two) times daily.   fluticasone 50 MCG/ACT nasal spray Commonly known as:  FLONASE Place 2 sprays into both nostrils daily.   HYDROcodone-acetaminophen 5-325 MG tablet Commonly known as:  NORCO/VICODIN Take 2 tablets by mouth every 4 (four) hours as needed. What changed:  reasons to take this   ibuprofen 200 MG tablet Commonly known as:  ADVIL,MOTRIN Take 2  tablets (400 mg total) by mouth every 8 (eight) hours as needed for fever. What changed:  when to take this   INVOKAMET 150-500 MG Tabs Generic drug:  Canagliflozin-metFORMIN HCl Take 1 tablet by mouth daily.   losartan-hydrochlorothiazide 50-12.5 MG tablet Commonly known as:  HYZAAR Take 1 tablet by mouth daily.   naproxen 500 MG tablet Commonly known as:  NAPROSYN Take 1 tablet (500 mg total) by mouth 2 (two) times daily.   omeprazole 20 MG capsule Commonly known as:  PRILOSEC Take 1 capsule by mouth daily.   oxyCODONE 5 MG immediate release tablet Commonly known as:  Oxy IR/ROXICODONE Take 1 tablet (5 mg total) by mouth every 6 (six) hours as needed for moderate pain.   PROAIR HFA 108 (90 Base) MCG/ACT inhaler Generic drug:  albuterol Inhale 1 puff into the lungs every 6 (six) hours as needed for wheezing or shortness of breath.   simvastatin 10 MG tablet Commonly known as:  ZOCOR Take 1 tablet by mouth daily.   topiramate 50 MG tablet Commonly known as:  TOPAMAX Take 1 tablet (50 mg total) by mouth 2 (two) times daily.   zolpidem 10 MG tablet Commonly known as:  AMBIEN Take 10 mg by mouth 3 (three) times a week.     ASK your doctor about these medications   amoxicillin-clavulanate 875-125 MG tablet Commonly known as:  AUGMENTIN Take 1 tablet by mouth 2 (two) times daily for 7 days. Ask about: Should I take this medication?        Follow-up Information    [facility] Go on 01/06/2018.   Specialty:  General Surgery Why:  at 2:00 PM for drain removal. please arrive 30 minutes early to get checked in and fill out any necessary paperwork. Contact information: [address] [place] [address] [phone]       Kinsinger, De Blanch, MD. Go on 01/19/2018.   Specialty:  General Surgery Why:  at 3:00 PM for post-operative follow up. please arrive 15 minutes early. Contact information: [address] [place] [address] [phone]           Signed: Hosie Spangle, [facility] 01/09/2018, 12:38 PM

## 2019-04-25 IMAGING — US US ABDOMEN LIMITED
1 series · 14 of 25 positions shown · non-contrast
Comparison: None.

CLINICAL DATA: Right upper quadrant pain.

EXAM:
ULTRASOUND ABDOMEN LIMITED RIGHT UPPER QUADRANT

[Series 1: us abdomen limited · 14 of 40 slices shown]
[im 1/40]
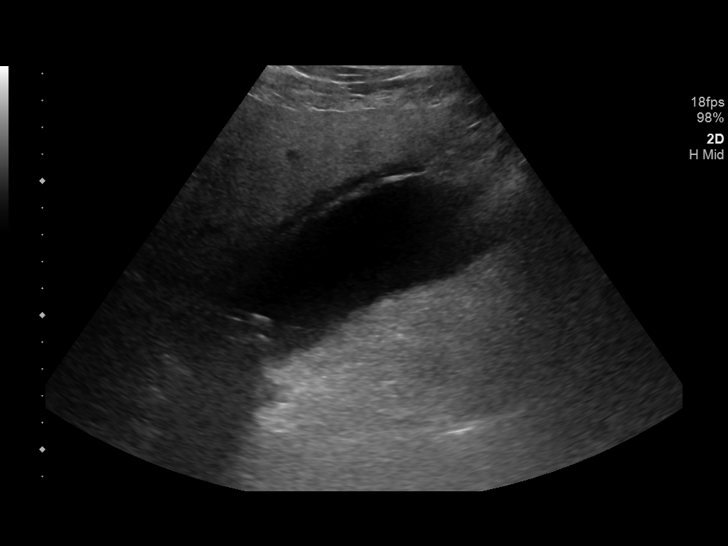
[im 4/40]
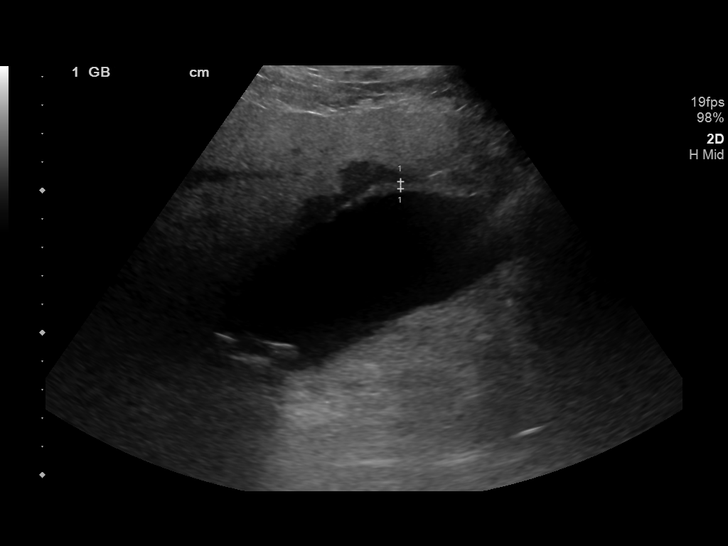
[im 7/40]
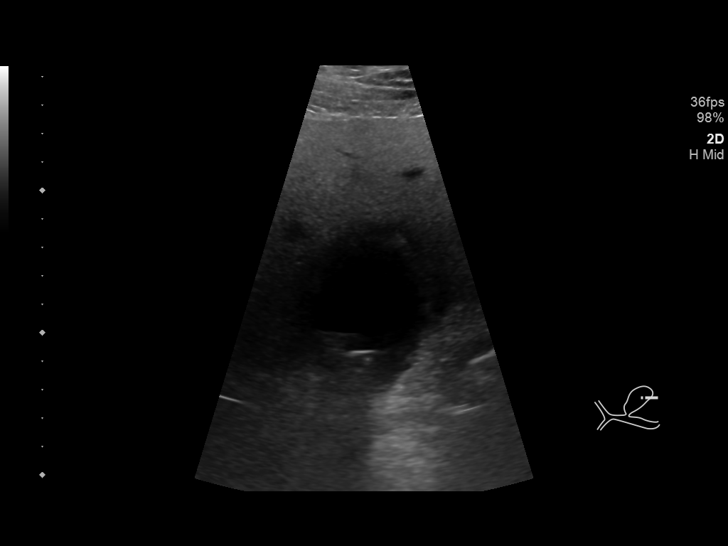
[im 10/40]
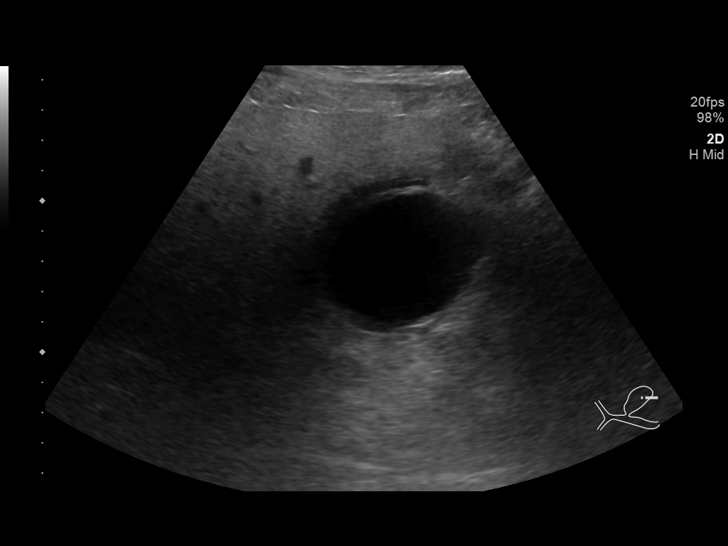
[im 14/40]
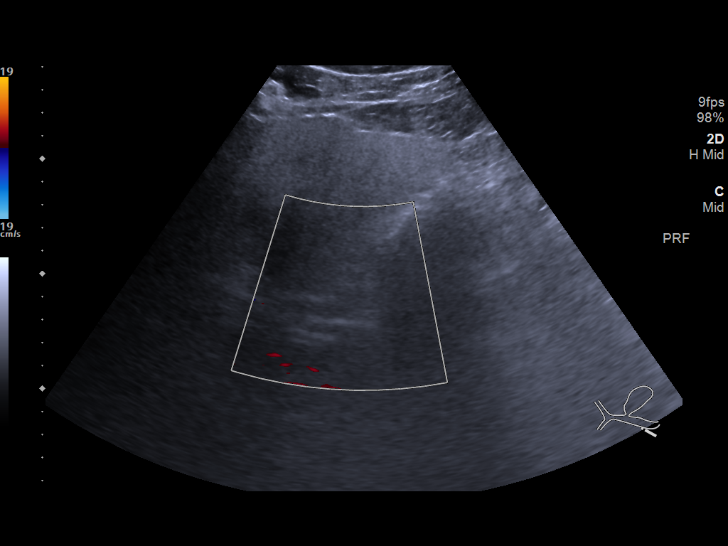
[im 15/40]
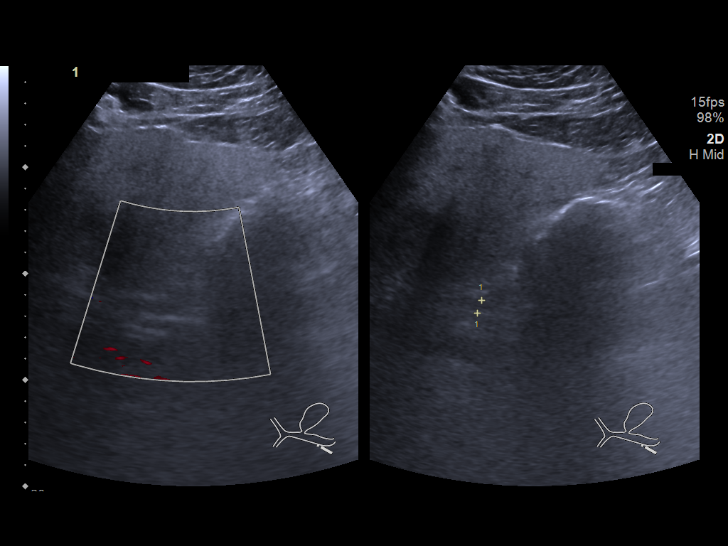
[im 18/40]
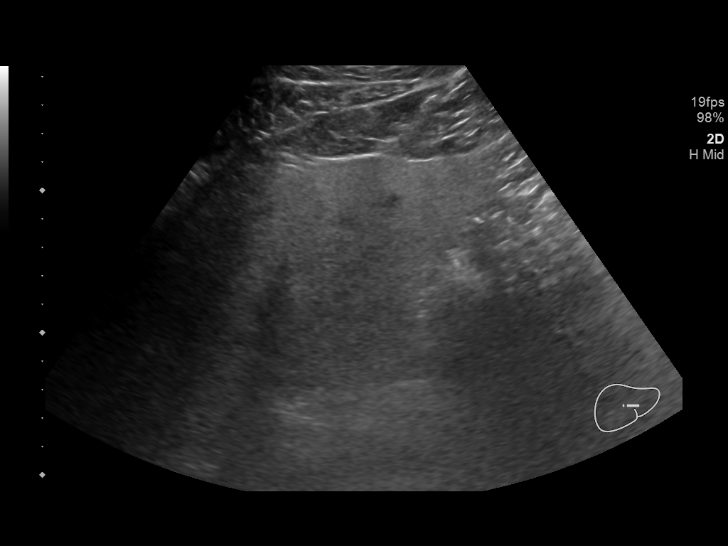
[im 22/40]
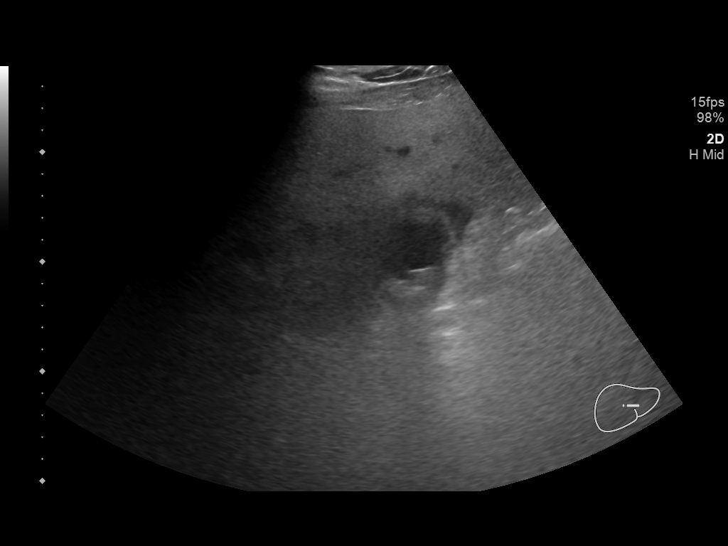
[im 25/40]
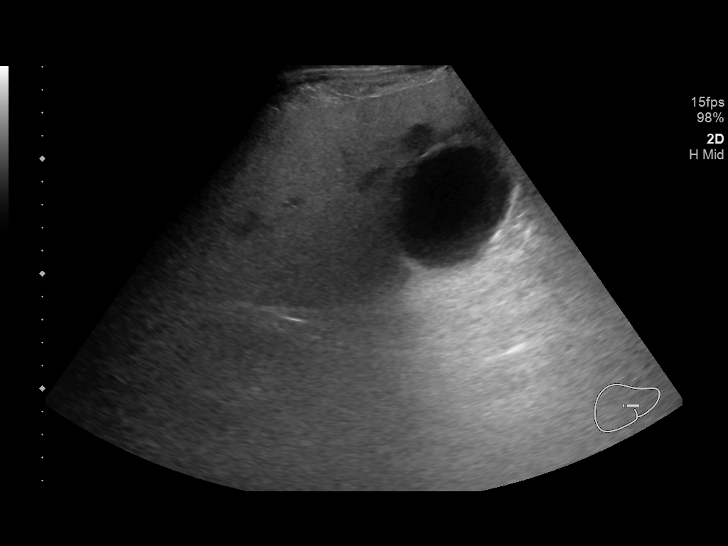
[im 27/40]
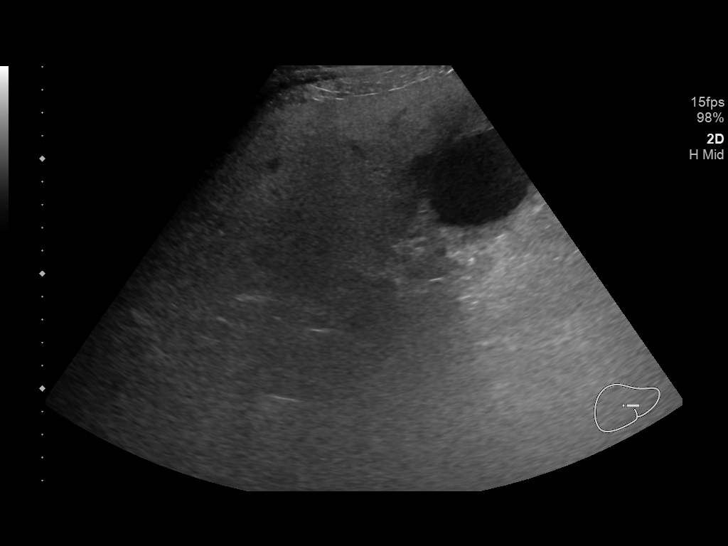
[im 30/40]
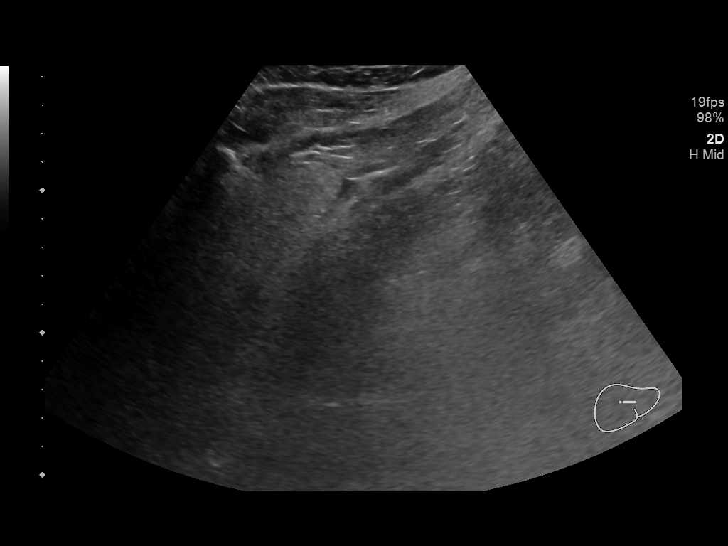
[im 33/40]
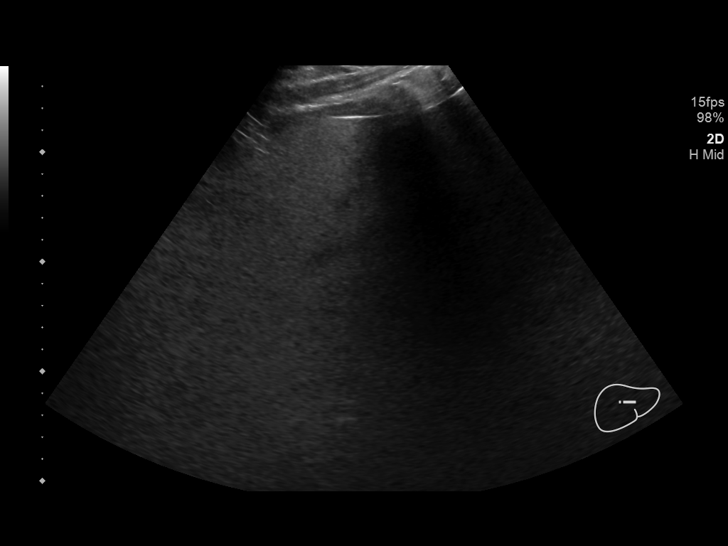
[im 36/40]
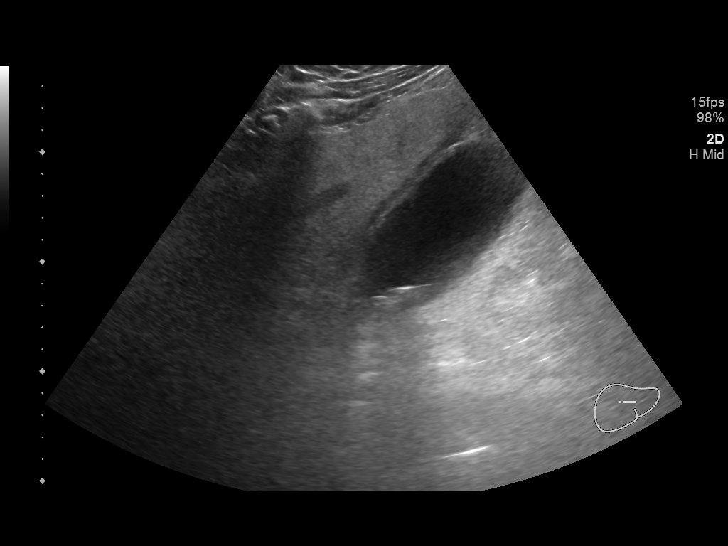
[im 40/40]
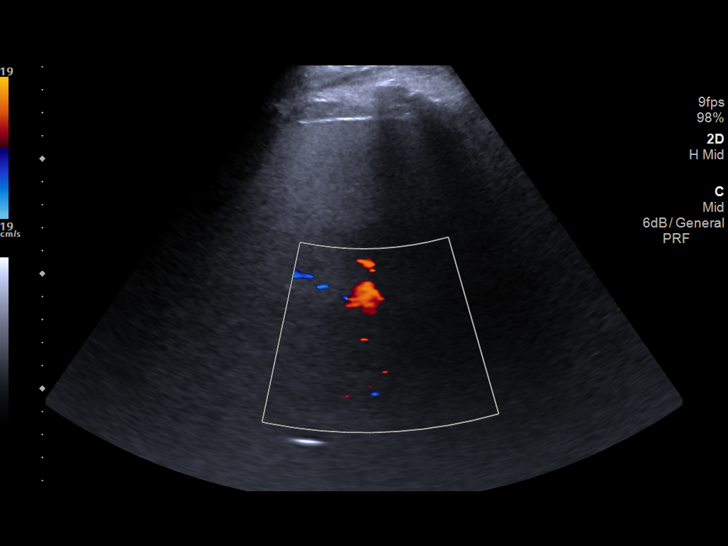

[14 of 25 positions shown; findings below may reference images not displayed]

FINDINGS: Gallbladder:

Cholelithiasis with stones measuring up to 8 mm in diameter.
Borderline wall thickening with pericholecystic edema. Murphy's sign
is negative.

Common bile duct:

Diameter: 6.3 mm, upper limits of normal. Proximal common bile duct
is not very well seen due to fatty infiltration of the liver.

Liver:

Prominent increased hepatic parenchymal echotexture consistent with
severe diffuse fatty infiltration. No definite focal lesions. Portal
vein is patent on color Doppler imaging with normal direction of
blood flow towards the liver.
IMPRESSION: Cholelithiasis with mild pericholecystic edema. Murphy's sign is
negative. Changes could reflect cholecystitis in the appropriate
clinical setting. Severe diffuse fatty infiltration of the liver.

## 2020-04-17 ENCOUNTER — Other Ambulatory Visit: Payer: Self-pay | Admitting: Internal Medicine

## 2020-04-18 LAB — COMPLETE METABOLIC PANEL WITH GFR
AG Ratio: 1.7 (calc) (ref 1.0–2.5)
ALT: 43 U/L (ref 9–46)
AST: 25 U/L (ref 10–35)
Albumin: 5 g/dL (ref 3.6–5.1)
Alkaline phosphatase (APISO): 52 U/L (ref 35–144)
BUN: 16 mg/dL (ref 7–25)
CO2: 28 mmol/L (ref 20–32)
Calcium: 10.9 mg/dL — ABNORMAL HIGH (ref 8.6–10.3)
Chloride: 97 mmol/L — ABNORMAL LOW (ref 98–110)
Creat: 1.03 mg/dL (ref 0.70–1.33)
GFR, Est African American: 92 mL/min/{1.73_m2} (ref >=60)
GFR, Est Non African American: 80 mL/min/{1.73_m2} (ref >=60)
Globulin: 2.9 g/dL (calc) (ref 1.9–3.7)
Glucose, Bld: 128 mg/dL — ABNORMAL HIGH (ref 65–99)
Potassium: 3.7 mmol/L (ref 3.5–5.3)
Sodium: 142 mmol/L (ref 135–146)
Total Bilirubin: 0.8 mg/dL (ref 0.2–1.2)
Total Protein: 7.9 g/dL (ref 6.1–8.1)

## 2020-04-18 LAB — LIPID PANEL
Cholesterol: 186 mg/dL (ref <200)
HDL: 40 mg/dL (ref >=40)
LDL Cholesterol (Calc): 112 mg/dL (calc) — ABNORMAL HIGH
Non-HDL Cholesterol (Calc): 146 mg/dL (calc) — ABNORMAL HIGH (ref <130)
Total CHOL/HDL Ratio: 4.7 (calc) (ref <5.0)
Triglycerides: 222 mg/dL — ABNORMAL HIGH (ref <150)

## 2020-04-18 LAB — CBC
HCT: 51.7 % — ABNORMAL HIGH (ref 38.5–50.0)
Hemoglobin: 17.8 g/dL — ABNORMAL HIGH (ref 13.2–17.1)
MCH: 31.7 pg (ref 27.0–33.0)
MCHC: 34.4 g/dL (ref 32.0–36.0)
MCV: 92 fL (ref 80.0–100.0)
MPV: 10.6 fL (ref 7.5–12.5)
Platelets: 278 10*3/uL (ref 140–400)
RBC: 5.62 10*6/uL (ref 4.20–5.80)
RDW: 13.5 % (ref 11.0–15.0)
WBC: 6.2 10*3/uL (ref 3.8–10.8)

## 2020-04-18 LAB — TSH: TSH: 1.91 mIU/L (ref 0.40–4.50)

## 2020-04-18 LAB — VITAMIN D 25 HYDROXY (VIT D DEFICIENCY, FRACTURES): Vit D, 25-Hydroxy: 35 ng/mL (ref 30–100)

## 2020-04-18 LAB — PSA: PSA: 0.38 ng/mL (ref <=4.0)

## 2020-06-02 LAB — COLOGUARD: COLOGUARD: NEGATIVE

## 2021-05-11 ENCOUNTER — Other Ambulatory Visit: Payer: Self-pay | Admitting: Internal Medicine

## 2021-05-12 LAB — COMPLETE METABOLIC PANEL WITH GFR
AG Ratio: 1.7 (calc) (ref 1.0–2.5)
ALT: 33 U/L (ref 9–46)
AST: 21 U/L (ref 10–35)
Albumin: 4.7 g/dL (ref 3.6–5.1)
Alkaline phosphatase (APISO): 55 U/L (ref 35–144)
BUN: 20 mg/dL (ref 7–25)
CO2: 26 mmol/L (ref 20–32)
Calcium: 9.6 mg/dL (ref 8.6–10.3)
Chloride: 102 mmol/L (ref 98–110)
Creat: 0.96 mg/dL (ref 0.70–1.30)
Globulin: 2.8 g/dL (calc) (ref 1.9–3.7)
Glucose, Bld: 140 mg/dL — ABNORMAL HIGH (ref 65–99)
Potassium: 4.5 mmol/L (ref 3.5–5.3)
Sodium: 140 mmol/L (ref 135–146)
Total Bilirubin: 0.6 mg/dL (ref 0.2–1.2)
Total Protein: 7.5 g/dL (ref 6.1–8.1)
eGFR: 91 mL/min/{1.73_m2} (ref >=60)

## 2021-05-12 LAB — CBC
HCT: 51.8 % — ABNORMAL HIGH (ref 38.5–50.0)
Hemoglobin: 17.5 g/dL — ABNORMAL HIGH (ref 13.2–17.1)
MCH: 31.5 pg (ref 27.0–33.0)
MCHC: 33.8 g/dL (ref 32.0–36.0)
MCV: 93.3 fL (ref 80.0–100.0)
MPV: 10.4 fL (ref 7.5–12.5)
Platelets: 266 10*3/uL (ref 140–400)
RBC: 5.55 10*6/uL (ref 4.20–5.80)
RDW: 14 % (ref 11.0–15.0)
WBC: 6.3 10*3/uL (ref 3.8–10.8)

## 2021-05-12 LAB — VITAMIN D 25 HYDROXY (VIT D DEFICIENCY, FRACTURES): Vit D, 25-Hydroxy: 24 ng/mL — ABNORMAL LOW (ref 30–100)

## 2021-05-12 LAB — LIPID PANEL
Cholesterol: 226 mg/dL — ABNORMAL HIGH (ref <200)
HDL: 50 mg/dL (ref >=40)
LDL Cholesterol (Calc): 131 mg/dL (calc) — ABNORMAL HIGH
Non-HDL Cholesterol (Calc): 176 mg/dL (calc) — ABNORMAL HIGH (ref <130)
Total CHOL/HDL Ratio: 4.5 (calc) (ref <5.0)
Triglycerides: 317 mg/dL — ABNORMAL HIGH (ref <150)

## 2021-05-12 LAB — PSA: PSA: 0.25 ng/mL (ref <=4.00)

## 2021-05-12 LAB — TSH: TSH: 0.7 mIU/L (ref 0.40–4.50)

## 2021-08-14 DIAGNOSIS — R635 Abnormal weight gain: Secondary | ICD-10-CM | POA: Diagnosis not present

## 2021-08-14 DIAGNOSIS — Z79899 Other long term (current) drug therapy: Secondary | ICD-10-CM | POA: Diagnosis not present

## 2021-08-14 DIAGNOSIS — R748 Abnormal levels of other serum enzymes: Secondary | ICD-10-CM | POA: Diagnosis not present

## 2021-08-14 DIAGNOSIS — G894 Chronic pain syndrome: Secondary | ICD-10-CM | POA: Diagnosis not present

## 2021-08-14 DIAGNOSIS — Z6837 Body mass index (BMI) 37.0-37.9, adult: Secondary | ICD-10-CM | POA: Diagnosis not present

## 2021-08-14 DIAGNOSIS — R030 Elevated blood-pressure reading, without diagnosis of hypertension: Secondary | ICD-10-CM | POA: Diagnosis not present

## 2021-08-20 DIAGNOSIS — Z79899 Other long term (current) drug therapy: Secondary | ICD-10-CM | POA: Diagnosis not present

## 2021-08-26 DIAGNOSIS — K838 Other specified diseases of biliary tract: Secondary | ICD-10-CM | POA: Diagnosis not present

## 2021-08-26 DIAGNOSIS — K760 Fatty (change of) liver, not elsewhere classified: Secondary | ICD-10-CM | POA: Diagnosis not present

## 2021-08-26 DIAGNOSIS — R945 Abnormal results of liver function studies: Secondary | ICD-10-CM | POA: Diagnosis not present

## 2021-09-10 DIAGNOSIS — G894 Chronic pain syndrome: Secondary | ICD-10-CM | POA: Diagnosis not present

## 2021-09-10 DIAGNOSIS — Z79899 Other long term (current) drug therapy: Secondary | ICD-10-CM | POA: Diagnosis not present

## 2021-09-10 DIAGNOSIS — Z6837 Body mass index (BMI) 37.0-37.9, adult: Secondary | ICD-10-CM | POA: Diagnosis not present

## 2021-09-10 DIAGNOSIS — N529 Male erectile dysfunction, unspecified: Secondary | ICD-10-CM | POA: Diagnosis not present

## 2021-09-10 DIAGNOSIS — R030 Elevated blood-pressure reading, without diagnosis of hypertension: Secondary | ICD-10-CM | POA: Diagnosis not present

## 2021-09-17 ENCOUNTER — Other Ambulatory Visit: Payer: Self-pay | Admitting: Gastroenterology

## 2021-09-17 DIAGNOSIS — K838 Other specified diseases of biliary tract: Secondary | ICD-10-CM

## 2021-10-08 ENCOUNTER — Ambulatory Visit
Admission: RE | Admit: 2021-10-08 | Discharge: 2021-10-08 | Disposition: A | Discharge status: Home / Self Care | Payer: Medicare HMO | Source: Ambulatory Visit | Attending: Gastroenterology | Admitting: Gastroenterology

## 2021-10-08 DIAGNOSIS — K838 Other specified diseases of biliary tract: Secondary | ICD-10-CM

## 2021-10-08 MED ORDER — GADOBENATE DIMEGLUMINE 529 MG/ML IV SOLN
20.0000 mL | Freq: Once | INTRAVENOUS | Status: AC | PRN
  Administered 2021-10-08: 20 mL via INTRAVENOUS

## 2022-01-27 ENCOUNTER — Other Ambulatory Visit (HOSPITAL_COMMUNITY): Payer: Self-pay | Admitting: Internal Medicine

## 2022-01-27 DIAGNOSIS — I10 Essential (primary) hypertension: Secondary | ICD-10-CM

## 2023-10-04 LAB — COLOGUARD: COLOGUARD: NEGATIVE

## 2024-01-05 ENCOUNTER — Emergency Department (HOSPITAL_COMMUNITY)

## 2024-01-05 ENCOUNTER — Other Ambulatory Visit: Payer: Self-pay

## 2024-01-05 ENCOUNTER — Emergency Department (HOSPITAL_COMMUNITY)
Admission: EM | Admit: 2024-01-05 | Discharge: 2024-01-05 | Disposition: A | Discharge status: Home / Self Care | Attending: Emergency Medicine | Admitting: Emergency Medicine

## 2024-01-05 DIAGNOSIS — M25511 Pain in right shoulder: Secondary | ICD-10-CM | POA: Insufficient documentation

## 2024-01-05 DIAGNOSIS — M549 Dorsalgia, unspecified: Secondary | ICD-10-CM

## 2024-01-05 DIAGNOSIS — I10 Essential (primary) hypertension: Secondary | ICD-10-CM | POA: Diagnosis not present

## 2024-01-05 DIAGNOSIS — E119 Type 2 diabetes mellitus without complications: Secondary | ICD-10-CM | POA: Insufficient documentation

## 2024-01-05 DIAGNOSIS — G8929 Other chronic pain: Secondary | ICD-10-CM

## 2024-01-05 MED ORDER — METHOCARBAMOL 500 MG PO TABS
500.0000 mg | ORAL_TABLET | Freq: Once | ORAL | Status: AC
  Administered 2024-01-05: 500 mg via ORAL
  Filled 2024-01-05: qty 1

## 2024-01-05 MED ORDER — IBUPROFEN 400 MG PO TABS
600.0000 mg | ORAL_TABLET | Freq: Once | ORAL | Status: AC
  Administered 2024-01-05: 600 mg via ORAL
  Filled 2024-01-05: qty 1

## 2024-01-05 MED ORDER — METHOCARBAMOL 500 MG PO TABS: 500.0000 mg | ORAL_TABLET | Freq: Two times a day (BID) | ORAL | 0 refills | Status: AC | PRN

## 2024-01-05 MED ORDER — LIDOCAINE 5 % EX PTCH
1.0000 | MEDICATED_PATCH | Freq: Once | CUTANEOUS | Status: DC
  Administered 2024-01-05: 1 via TRANSDERMAL
  Filled 2024-01-05: qty 1

## 2024-01-05 NOTE — ED Provider Notes (Signed)
 [place] EMERGENCY DEPARTMENT AT [facility] Provider Note  MDM   HPI/ROS:  Raymond Trevino is a 63 y.o. male with a PMH of OSA, obesity who presents with right shoulder pain.  He states that has been ongoing for about a month.  Denies any injury.  Patient also reports that he recently got a new mattress and it is gotten worse since then, he has trouble with laying down to sleep at night specifically on his right side and difficulty with turning his neck to the side.  Differentials include musculoskeletal strain, fracture, joint effusion, septic joint, cellulitis  Physical exam is notable for: - Trapezius tenderness and tightness on the right, full ROM at the shoulder joint in flexion and extension, able to raise his shoulder to his ER, slight tenderness over the scapula.  No midline cervical spine or paraspinal tenderness. -- No evidence of overlying skin changes including erythema, edema, rash  On my initial evaluation, patient is:  -Vital signs stable. Patient afebrile, hemodynamically stable, and non-toxic appearing.  CXR with some degenerative changes over the shoulder, with no acute fractures or malalignment.  Low suspicion for septic joint, gout, no symptoms for vascular or neurologic compromise.   Patient's presentation likely secondary to musculoskeletal strain, he was given Robaxin , lidocaine  patch with resolution and improvement of his symptoms.  Interpretations, interventions, and the patient's course of care are documented below.    Clinical Course as of 01/06/24 0641  Thu Jan 05, 2024  1039  Moderate glenohumeral joint space narrowing. 2. Subacromial spurring. 3. Moderate hypertrophic changes of the acromioclavicular joint.   [SA]    Clinical Course User Index [SA] Billy Pal, MD     Disposition:  I discussed the plan for discharge with the patient and/or their surrogate at bedside prior to discharge and they were in agreement with the plan and  verbalized understanding of the return precautions provided. All questions answered to the best of my ability. Ultimately, the patient was discharged in stable condition with stable vital signs. I am reassured that they are capable of close follow up and good social support at home.   Clinical Impression: No diagnosis found.  Rx / DC Orders ED Discharge Orders          Ordered    AMB referral to sports medicine        01/05/24 1057    methocarbamol  (ROBAXIN ) 500 MG tablet  2 times daily PRN        01/05/24 1058            Clinical Complexity A medically appropriate history, review of systems, and physical exam was performed.  My independent interpretations of EKG, labs, and radiology are documented in the ED course above.   If decision rules were used in this patient's evaluation, they are listed below.   Click here for ABCD2, HEART and other calculatorsREFRESH Note before signing   Patient's presentation is most consistent with acute, uncomplicated illness.  Medical Decision Making Amount and/or Complexity of Data Reviewed Radiology: ordered.  Risk Prescription drug management.    HPI/ROS      See MDM section for pertinent HPI and ROS. A complete ROS was performed with pertinent positives/negatives noted above.   Past Medical History:  Diagnosis Date   Chronic hip pain    on chronic narcotics   Depression    Diabetes mellitus without complication (HCC)    Fatty liver disease, nonalcoholic    Hypertension    Migraine  OSA (obstructive sleep apnea)     Past Surgical History:  Procedure Laterality Date   CHOLECYSTECTOMY N/A 12/28/2017   Procedure: LAPAROSCOPIC SUBTOTAL CHOLECYSTECTOMY;  Surgeon: Stevie Herlene Righter, MD;  Location: [facility] ORS;  Service: General;  Laterality: N/A;   HIP SURGERY  2012, 2013   WRIST SURGERY  1988      Physical Exam   Vitals:   01/05/24 0857 01/05/24 0915  BP: (!) 151/98   Pulse: 93   Resp: 18   Temp: 99.5 F (37.5 C)    TempSrc: Oral   SpO2: 90%   Weight:  124.7 kg  Height:  6' (1.829 m)    Physical Exam Vitals and nursing note reviewed.  Constitutional:      General: He is not in acute distress.    Appearance: He is well-developed.  HENT:     Head: Normocephalic and atraumatic.  Eyes:     Conjunctiva/sclera: Conjunctivae normal.  Cardiovascular:     Rate and Rhythm: Normal rate and regular rhythm.     Heart sounds: No murmur heard. Pulmonary:     Effort: Pulmonary effort is normal. No respiratory distress.     Breath sounds: Normal breath sounds.  Abdominal:     Palpations: Abdomen is soft.     Tenderness: There is no abdominal tenderness.  Musculoskeletal:     Cervical back: Neck supple.     Comments: - Trapezius tenderness and tightness on the right, full ROM at the shoulder joint in flexion and extension, able to raise his shoulder to his ER, slight tenderness over the scapula.  No midline cervical spine or paraspinal tenderness. -- No evidence of overlying skin changes including erythema, edema, rash  Skin:    General: Skin is warm and dry.     Capillary Refill: Capillary refill takes less than 2 seconds.  Neurological:     Mental Status: He is alert.      Procedures   If procedures were preformed on this patient, they are listed below:  Procedures  S. Karagan Lehr, DO Electronically signed   Please note that this documentation was produced with the assistance of voice-to-text technology and may contain errors.     Billy Pal, MD 01/06/24 9353    Doretha Folks, MD 01/06/24 [id number]

## 2024-01-05 NOTE — ED Triage Notes (Signed)
 Pt. Stated, Ive had right shoulder pain for abput a month. Denies any injury

## 2024-01-05 NOTE — ED Provider Notes (Incomplete)
 Patient is a 63 year old male presenting today with posterior shoulder and neck pain.  Reports that it started approximately 3 to 4 weeks ago and is only getting worse.  He does report that he recently got a new mattress and its only gotten worse since then.  Now he can even lay down at night to sleep because it aches.  It is also painful when he moves his head from left or right.  On exam patient has significant pain in the trapezius area with spasm palpated.  X-ray does show some degenerative changes in the shoulder as well.  Could be twofold.  Low suspicion for septic joint, gout and no symptoms concerning for vascular or neuro compromise.  Will give muscle relaxer, patient will continue using heating pad and he will need to follow-up with orthopedics if he is not getting better.  Patient is not having new cough, chest pain or shortness of breath.  No findings to suggest ACS.

## 2024-01-05 NOTE — Discharge Instructions (Signed)
 Raymond Trevino:  Thank you for allowing us  to take care of you today.  We hope you begin feeling better soon. You were seen today for right shoulder pain.  While you are here we did a physical exam, lab work as well as x-ray imaging.  There is no emergent cause of your symptoms found today.  It is likely a musculoskeletal trapezius strain.  You do also have some arthritic changes in your shoulder joint.  I recommend continuing with the lidocaine  patches, using Tylenol /ibuprofen  alternating every 4 hours for pain and as needed Robaxin  (muscle relaxer) that was prescribed for breakthrough pain. Please follow-up with sports medicine/PCP for further workup.  To-Do:  Please follow-up with your primary doctor within the next 2-3 days. It is important that you review any labs or imaging results (if any) that you had today with them. Your preliminary imaging results (if any) are attached. Please return to the Emergency Department or call 911 if you experience chest pain, shortness of breath, severe pain, severe fever, altered mental status, or have any reason to think that you need emergency medical care.  Thank you again.  Hope you feel better soon.    Department of Emergency Medicine problems 12 5

## 2024-02-02 ENCOUNTER — Ambulatory Visit: Admitting: Family Medicine
